# Patient Record
Sex: Male | Born: 1980 | Race: White | Hispanic: Yes | Marital: Single | Smoking: Current some day smoker
Health system: Southern US, Community
[De-identification: ages and names within clinical notes are randomized; demographics above are authoritative.]

## PROBLEM LIST (undated history)

## (undated) ENCOUNTER — Ambulatory Visit: Payer: BC Managed Care – PPO | Source: Home / Self Care

## (undated) DIAGNOSIS — Z21 Asymptomatic human immunodeficiency virus [HIV] infection status: Secondary | ICD-10-CM

## (undated) DIAGNOSIS — B009 Herpesviral infection, unspecified: Secondary | ICD-10-CM

## (undated) DIAGNOSIS — B20 Human immunodeficiency virus [HIV] disease: Secondary | ICD-10-CM

## (undated) DIAGNOSIS — K219 Gastro-esophageal reflux disease without esophagitis: Secondary | ICD-10-CM

## (undated) DIAGNOSIS — A549 Gonococcal infection, unspecified: Secondary | ICD-10-CM

## (undated) DIAGNOSIS — A539 Syphilis, unspecified: Secondary | ICD-10-CM

## (undated) HISTORY — DX: Gonococcal infection, unspecified: A54.9

## (undated) HISTORY — DX: Syphilis, unspecified: A53.9

## (undated) HISTORY — DX: Herpesviral infection, unspecified: B00.9

---

## 2004-08-01 ENCOUNTER — Emergency Department (HOSPITAL_COMMUNITY): Admission: EM | Admit: 2004-08-01 | Discharge: 2004-08-01 | Payer: Self-pay | Admitting: Emergency Medicine

## 2005-08-27 ENCOUNTER — Emergency Department (HOSPITAL_COMMUNITY): Admission: EM | Admit: 2005-08-27 | Discharge: 2005-08-27 | Payer: Self-pay | Admitting: Emergency Medicine

## 2006-09-22 ENCOUNTER — Emergency Department (HOSPITAL_COMMUNITY): Admission: EM | Admit: 2006-09-22 | Discharge: 2006-09-22 | Payer: Self-pay | Admitting: Family Medicine

## 2009-02-04 ENCOUNTER — Emergency Department (HOSPITAL_COMMUNITY): Admission: EM | Admit: 2009-02-04 | Discharge: 2009-02-04 | Payer: Self-pay | Admitting: Emergency Medicine

## 2009-03-16 ENCOUNTER — Encounter: Admission: RE | Admit: 2009-03-16 | Discharge: 2009-03-16 | Payer: Self-pay | Admitting: Family Medicine

## 2009-10-06 ENCOUNTER — Emergency Department (HOSPITAL_COMMUNITY): Admission: EM | Admit: 2009-10-06 | Discharge: 2009-10-06 | Payer: Self-pay | Admitting: Emergency Medicine

## 2018-11-14 ENCOUNTER — Other Ambulatory Visit: Payer: Self-pay

## 2018-11-14 DIAGNOSIS — Z20822 Contact with and (suspected) exposure to covid-19: Secondary | ICD-10-CM

## 2018-11-15 LAB — NOVEL CORONAVIRUS, NAA: SARS-CoV-2, NAA: NOT DETECTED

## 2019-02-02 ENCOUNTER — Other Ambulatory Visit: Payer: Self-pay

## 2019-02-02 DIAGNOSIS — Z20822 Contact with and (suspected) exposure to covid-19: Secondary | ICD-10-CM

## 2019-02-03 LAB — NOVEL CORONAVIRUS, NAA: SARS-CoV-2, NAA: NOT DETECTED

## 2019-06-18 ENCOUNTER — Other Ambulatory Visit: Payer: Self-pay

## 2019-06-18 ENCOUNTER — Encounter: Payer: Self-pay | Admitting: Podiatry

## 2019-06-18 ENCOUNTER — Ambulatory Visit: Payer: BC Managed Care – PPO | Admitting: Podiatry

## 2019-06-18 DIAGNOSIS — B351 Tinea unguium: Secondary | ICD-10-CM | POA: Diagnosis not present

## 2019-06-18 NOTE — Progress Notes (Signed)
  Subjective:  Patient ID: Collin Thomas, male    DOB: March 13, 1981,  MRN: [id number]  Chief Complaint  Patient presents with  . Nail Problem    Pt wants to discuss laser treatment for fungal toenails.    39 y.o. male presents with the above complaint.  Patient presents with left hallux onychomycosis that has been going on for a long period of time for years.  Patient would like to know if there is anything that can be done for this.  He has tried Lamisil but he has had sensitivity with it.  He had a culture done of the toenail by his primary care doctor that showed that patient has Trichophyton species.  Patient states he would like to consider doing laser therapy has less appears to be noninvasive.  He denies any other acute complaints.   Review of Systems: Negative except as noted in the HPI. Denies N/V/F/Ch.  No past medical history on file.  Current Outpatient Medications:  .  Cephalexin 500 MG tablet, TAKE 1 TABLET BY MOUTH THREE TIMES A DAY TAKE 1 TABLET 3 X A DAY WITH FOOD FOR 10 DAYS, Disp: , Rfl:  .  penicillin v potassium (VEETID) 500 MG tablet, Take 500 mg by mouth 2 (two) times daily., Disp: , Rfl:  .  sulfamethoxazole-trimethoprim (BACTRIM DS) 800-160 MG tablet, Take 1 tablet by mouth 2 (two) times daily., Disp: , Rfl:  .  valACYclovir (VALTREX) 500 MG tablet, Take 500 mg by mouth 2 (two) times daily., Disp: , Rfl:   Social History   Tobacco Use  Smoking Status Not on file    Not on File Objective:  There were no vitals filed for this visit. There is no height or weight on file to calculate BMI. Constitutional Well developed. Well nourished.  Vascular Dorsalis pedis pulses palpable bilaterally. Posterior tibial pulses palpable bilaterally. Capillary refill normal to all digits.  No cyanosis or clubbing noted. Pedal hair growth normal.  Neurologic Normal speech. Oriented to person, place, and time. Epicritic sensation to light touch grossly present bilaterally.    Dermatologic  left hallux onychomycosis with dystrophic discolored, darkened toenail.  Orthopedic: Normal joint ROM without pain or crepitus bilaterally. No visible deformities. No bony tenderness.   Radiographs: None Assessment:   1. Nail fungus   2. Onychomycosis due to dermatophyte    Plan:  Patient was evaluated and treated and all questions answered.  Left hallux onychomycosis -Educated the patient on the etiology of onychomycosis and various treatment options associated with improving the fungal load.  I explained to the patient that there is 3 treatment options available to treat the onychomycosis including topical, p.o., laser treatment.  He does not want to do oral therapy and therefore we will do a laser treatment.  I explained to the patient may take 6-7 sessions to help clear the nail.  Patient will be scheduled to see Collin Thomas for left hallux onychomycosis.     Return for See Collin Thomas for laser.

## 2019-07-01 DIAGNOSIS — S29012A Strain of muscle and tendon of back wall of thorax, initial encounter: Secondary | ICD-10-CM | POA: Diagnosis not present

## 2019-07-01 DIAGNOSIS — M9901 Segmental and somatic dysfunction of cervical region: Secondary | ICD-10-CM | POA: Diagnosis not present

## 2019-07-01 DIAGNOSIS — M5382 Other specified dorsopathies, cervical region: Secondary | ICD-10-CM | POA: Diagnosis not present

## 2019-07-15 ENCOUNTER — Other Ambulatory Visit: Payer: BC Managed Care – PPO

## 2019-07-20 DIAGNOSIS — Z7251 High risk heterosexual behavior: Secondary | ICD-10-CM | POA: Diagnosis not present

## 2019-07-20 DIAGNOSIS — N390 Urinary tract infection, site not specified: Secondary | ICD-10-CM | POA: Diagnosis not present

## 2019-07-31 ENCOUNTER — Ambulatory Visit: Payer: Self-pay | Attending: Internal Medicine

## 2019-07-31 DIAGNOSIS — Z23 Encounter for immunization: Secondary | ICD-10-CM

## 2019-07-31 NOTE — Progress Notes (Signed)
   Covid-19 Vaccination Clinic  Name:  GEAN LAROSE    MRN: [id number] DOB: Oct 26, 1980  07/31/2019  Mr. Henricksen was observed post Covid-19 immunization for 15 minutes without incident. He was provided with Vaccine Information Sheet and instruction to access the V-Safe system.   Mr. Nijjar was instructed to call 911 with any severe reactions post vaccine: Marland Kitchen Difficulty breathing  . Swelling of face and throat  . A fast heartbeat  . A bad rash all over body  . Dizziness and weakness   Immunizations Administered    Name Date Dose VIS Date Route   Pfizer COVID-19 Vaccine 07/31/2019  9:17 AM 0.3 mL 03/26/2019 Intramuscular   Manufacturer: [employer], [employer]   Lot: [id number]   NDC: [address]-7

## 2019-08-13 ENCOUNTER — Ambulatory Visit (INDEPENDENT_AMBULATORY_CARE_PROVIDER_SITE_OTHER): Payer: BC Managed Care – PPO | Admitting: *Deleted

## 2019-08-13 ENCOUNTER — Other Ambulatory Visit: Payer: Self-pay

## 2019-08-13 DIAGNOSIS — B351 Tinea unguium: Secondary | ICD-10-CM

## 2019-08-13 NOTE — Progress Notes (Signed)
Patient presents today for the 1st laser treatment. Diagnosed with mycotic nail infection by Dr. Allena Katz.   Toenail most affected is the hallux left.  All other systems are negative.  Nails were filed thin. Laser therapy was administered to 1-5 toenails bilateral and patient tolerated the treatment well. All safety precautions were in place.    Follow up in 4 weeks for laser # 2.  Explained that laser protocol is typically 6 treatments, once per month, however, patient states he would like to come indefinitely.

## 2019-08-13 NOTE — Patient Instructions (Signed)

## 2019-08-23 ENCOUNTER — Ambulatory Visit: Payer: Self-pay | Attending: Internal Medicine

## 2019-08-23 DIAGNOSIS — Z23 Encounter for immunization: Secondary | ICD-10-CM

## 2019-08-23 NOTE — Progress Notes (Signed)
   Covid-19 Vaccination Clinic  Name:  Collin Thomas    MRN: [id number] DOB: Sep 05, 1980  08/23/2019  Mr. [name] was observed post Covid-19 immunization for 15 minutes without incident. He was provided with Vaccine Information Sheet and instruction to access the V-Safe system.   Mr. [name] was instructed to call 911 with any severe reactions post vaccine: Marland Kitchen Difficulty breathing  . Swelling of face and throat  . A fast heartbeat  . A bad rash all over body  . Dizziness and weakness   Immunizations Administered    Name Date Dose VIS Date Route   Pfizer COVID-19 Vaccine 08/23/2019  9:47 AM 0.3 mL 06/09/2018 Intramuscular   Manufacturer: [employer], [employer]   Lot: [id number]   NDC: [address]-8

## 2019-09-10 ENCOUNTER — Other Ambulatory Visit: Payer: Self-pay

## 2019-09-10 ENCOUNTER — Ambulatory Visit (INDEPENDENT_AMBULATORY_CARE_PROVIDER_SITE_OTHER): Payer: BC Managed Care – PPO | Admitting: *Deleted

## 2019-09-10 DIAGNOSIS — B351 Tinea unguium: Secondary | ICD-10-CM

## 2019-09-10 NOTE — Progress Notes (Signed)
Patient presents today for the 2nd laser treatment. Diagnosed with mycotic nail infection by Dr. Allena Katz.   Toenail most affected is the hallux left. No change as of yet.  All other systems are negative.  Nails were filed thin. Laser therapy was administered to 1-5 toenails bilateral and patient tolerated the treatment well. All safety precautions were in place.    Follow up in 4 weeks for laser # 3  Explained that laser protocol is typically 6 treatments, once per month, however, patient states he would like to come indefinitely.

## 2019-10-13 ENCOUNTER — Other Ambulatory Visit: Payer: BC Managed Care – PPO

## 2019-10-15 ENCOUNTER — Other Ambulatory Visit: Payer: BC Managed Care – PPO

## 2019-10-27 ENCOUNTER — Telehealth: Payer: Self-pay | Admitting: Podiatry

## 2019-10-27 NOTE — Telephone Encounter (Signed)
I did receive a bill for a past due $100 bill for the laser treatment from the 09/10/19 appointment. I paid that and its reflecting on my billing statement as paid on 09/14/19. If you could please double check and make sure I get credit for that, I would greatly appreciate it. My number is [phone]. Thank you.

## 2019-11-22 ENCOUNTER — Other Ambulatory Visit: Payer: BC Managed Care – PPO

## 2019-12-02 ENCOUNTER — Other Ambulatory Visit: Payer: BC Managed Care – PPO

## 2019-12-06 ENCOUNTER — Other Ambulatory Visit: Payer: BC Managed Care – PPO

## 2019-12-27 DIAGNOSIS — L603 Nail dystrophy: Secondary | ICD-10-CM | POA: Diagnosis not present

## 2019-12-28 DIAGNOSIS — R0602 Shortness of breath: Secondary | ICD-10-CM | POA: Diagnosis not present

## 2019-12-28 DIAGNOSIS — Z131 Encounter for screening for diabetes mellitus: Secondary | ICD-10-CM | POA: Diagnosis not present

## 2019-12-28 DIAGNOSIS — Z1331 Encounter for screening for depression: Secondary | ICD-10-CM | POA: Diagnosis not present

## 2019-12-28 DIAGNOSIS — Z0000 Encounter for general adult medical examination without abnormal findings: Secondary | ICD-10-CM | POA: Diagnosis not present

## 2019-12-28 DIAGNOSIS — Z20822 Contact with and (suspected) exposure to covid-19: Secondary | ICD-10-CM | POA: Diagnosis not present

## 2019-12-28 DIAGNOSIS — D539 Nutritional anemia, unspecified: Secondary | ICD-10-CM | POA: Diagnosis not present

## 2019-12-28 DIAGNOSIS — R350 Frequency of micturition: Secondary | ICD-10-CM | POA: Diagnosis not present

## 2019-12-28 DIAGNOSIS — R5383 Other fatigue: Secondary | ICD-10-CM | POA: Diagnosis not present

## 2019-12-28 DIAGNOSIS — Z1159 Encounter for screening for other viral diseases: Secondary | ICD-10-CM | POA: Diagnosis not present

## 2019-12-28 DIAGNOSIS — A530 Latent syphilis, unspecified as early or late: Secondary | ICD-10-CM | POA: Diagnosis not present

## 2019-12-28 DIAGNOSIS — Z7251 High risk heterosexual behavior: Secondary | ICD-10-CM | POA: Diagnosis not present

## 2019-12-28 DIAGNOSIS — E559 Vitamin D deficiency, unspecified: Secondary | ICD-10-CM | POA: Diagnosis not present

## 2020-01-17 ENCOUNTER — Other Ambulatory Visit: Payer: BC Managed Care – PPO

## 2020-02-03 DIAGNOSIS — K120 Recurrent oral aphthae: Secondary | ICD-10-CM

## 2020-02-03 HISTORY — DX: Recurrent oral aphthae: K12.0

## 2020-02-18 DIAGNOSIS — M79645 Pain in left finger(s): Secondary | ICD-10-CM | POA: Diagnosis not present

## 2020-02-25 ENCOUNTER — Ambulatory Visit: Payer: BC Managed Care – PPO | Admitting: Podiatry

## 2020-02-28 ENCOUNTER — Other Ambulatory Visit: Payer: BC Managed Care – PPO

## 2020-03-17 ENCOUNTER — Ambulatory Visit: Payer: BC Managed Care – PPO | Admitting: Podiatry

## 2020-03-24 ENCOUNTER — Other Ambulatory Visit: Payer: BC Managed Care – PPO

## 2020-03-24 DIAGNOSIS — M79645 Pain in left finger(s): Secondary | ICD-10-CM | POA: Diagnosis not present

## 2020-04-21 DIAGNOSIS — L720 Epidermal cyst: Secondary | ICD-10-CM | POA: Diagnosis not present

## 2020-05-31 DIAGNOSIS — J069 Acute upper respiratory infection, unspecified: Secondary | ICD-10-CM | POA: Diagnosis not present

## 2020-05-31 DIAGNOSIS — R0602 Shortness of breath: Secondary | ICD-10-CM | POA: Diagnosis not present

## 2020-05-31 DIAGNOSIS — R059 Cough, unspecified: Secondary | ICD-10-CM | POA: Diagnosis not present

## 2020-06-08 DIAGNOSIS — J358 Other chronic diseases of tonsils and adenoids: Secondary | ICD-10-CM | POA: Diagnosis not present

## 2020-07-12 DIAGNOSIS — J309 Allergic rhinitis, unspecified: Secondary | ICD-10-CM | POA: Diagnosis not present

## 2020-07-12 DIAGNOSIS — J342 Deviated nasal septum: Secondary | ICD-10-CM | POA: Diagnosis not present

## 2020-07-12 DIAGNOSIS — R198 Other specified symptoms and signs involving the digestive system and abdomen: Secondary | ICD-10-CM | POA: Diagnosis not present

## 2020-07-12 DIAGNOSIS — J358 Other chronic diseases of tonsils and adenoids: Secondary | ICD-10-CM | POA: Diagnosis not present

## 2020-07-19 DIAGNOSIS — R509 Fever, unspecified: Secondary | ICD-10-CM | POA: Diagnosis not present

## 2020-07-19 DIAGNOSIS — J069 Acute upper respiratory infection, unspecified: Secondary | ICD-10-CM | POA: Diagnosis not present

## 2020-07-19 DIAGNOSIS — J302 Other seasonal allergic rhinitis: Secondary | ICD-10-CM | POA: Diagnosis not present

## 2020-07-23 ENCOUNTER — Emergency Department (HOSPITAL_COMMUNITY): Payer: BC Managed Care – PPO

## 2020-07-23 ENCOUNTER — Other Ambulatory Visit: Payer: Self-pay

## 2020-07-23 ENCOUNTER — Encounter (HOSPITAL_COMMUNITY): Payer: Self-pay | Admitting: Emergency Medicine

## 2020-07-23 ENCOUNTER — Emergency Department (HOSPITAL_COMMUNITY)
Admission: EM | Admit: 2020-07-23 | Discharge: 2020-07-23 | Disposition: A | Discharge status: Home / Self Care | Payer: BC Managed Care – PPO | Attending: Emergency Medicine | Admitting: Emergency Medicine

## 2020-07-23 DIAGNOSIS — R300 Dysuria: Secondary | ICD-10-CM | POA: Diagnosis not present

## 2020-07-23 DIAGNOSIS — R002 Palpitations: Secondary | ICD-10-CM | POA: Insufficient documentation

## 2020-07-23 DIAGNOSIS — F1510 Other stimulant abuse, uncomplicated: Secondary | ICD-10-CM | POA: Diagnosis not present

## 2020-07-23 DIAGNOSIS — R0789 Other chest pain: Secondary | ICD-10-CM | POA: Diagnosis not present

## 2020-07-23 DIAGNOSIS — F17200 Nicotine dependence, unspecified, uncomplicated: Secondary | ICD-10-CM | POA: Insufficient documentation

## 2020-07-23 DIAGNOSIS — R531 Weakness: Secondary | ICD-10-CM | POA: Diagnosis not present

## 2020-07-23 DIAGNOSIS — R030 Elevated blood-pressure reading, without diagnosis of hypertension: Secondary | ICD-10-CM | POA: Diagnosis not present

## 2020-07-23 DIAGNOSIS — R202 Paresthesia of skin: Secondary | ICD-10-CM | POA: Insufficient documentation

## 2020-07-23 DIAGNOSIS — R200 Anesthesia of skin: Secondary | ICD-10-CM | POA: Diagnosis not present

## 2020-07-23 DIAGNOSIS — R7989 Other specified abnormal findings of blood chemistry: Secondary | ICD-10-CM | POA: Diagnosis not present

## 2020-07-23 DIAGNOSIS — R000 Tachycardia, unspecified: Secondary | ICD-10-CM | POA: Diagnosis not present

## 2020-07-23 LAB — BASIC METABOLIC PANEL
Anion gap: 10 (ref 5–15)
BUN: 10 mg/dL (ref 6–20)
CO2: 22 mmol/L (ref 22–32)
Calcium: 9.4 mg/dL (ref 8.9–10.3)
Chloride: 103 mmol/L (ref 98–111)
Creatinine, Ser: 1.2 mg/dL (ref 0.61–1.24)
GFR, Estimated: >60 mL/min (ref >60)
Glucose, Bld: 111 mg/dL — ABNORMAL HIGH (ref 70–99)
Potassium: 3.6 mmol/L (ref 3.5–5.1)
Sodium: 135 mmol/L (ref 135–145)

## 2020-07-23 LAB — CBC
HCT: 41.8 % (ref 39.0–52.0)
Hemoglobin: 14.6 g/dL (ref 13.0–17.0)
MCH: 31.9 pg (ref 26.0–34.0)
MCHC: 34.9 g/dL (ref 30.0–36.0)
MCV: 91.3 fL (ref 80.0–100.0)
Platelets: 276 10*3/uL (ref 150–400)
RBC: 4.58 MIL/uL (ref 4.22–5.81)
RDW: 11.8 % (ref 11.5–15.5)
WBC: 10.3 10*3/uL (ref 4.0–10.5)
nRBC: 0 % (ref 0.0–0.2)

## 2020-07-23 LAB — TROPONIN I (HIGH SENSITIVITY)
Troponin I (High Sensitivity): 53 ng/L — ABNORMAL HIGH (ref <18)
Troponin I (High Sensitivity): 67 ng/L — ABNORMAL HIGH (ref <18)
Troponin I (High Sensitivity): 55 ng/L — ABNORMAL HIGH (ref <18)
Troponin I (High Sensitivity): 46 ng/L — ABNORMAL HIGH (ref <18)

## 2020-07-23 LAB — RAPID URINE DRUG SCREEN, HOSP PERFORMED
Amphetamines: POSITIVE — AB
Barbiturates: NOT DETECTED
Benzodiazepines: NOT DETECTED
Cocaine: NOT DETECTED
Opiates: NOT DETECTED
Tetrahydrocannabinol: NOT DETECTED

## 2020-07-23 LAB — D-DIMER, QUANTITATIVE: D-Dimer, Quant: 0.68 ug/mL-FEU — ABNORMAL HIGH (ref 0.00–0.50)

## 2020-07-23 MED ORDER — IOHEXOL 350 MG/ML SOLN
75.0000 mL | Freq: Once | INTRAVENOUS | Status: AC | PRN
  Administered 2020-07-23: 75 mL via INTRAVENOUS

## 2020-07-23 MED ORDER — DIAZEPAM 5 MG PO TABS
5.0000 mg | ORAL_TABLET | Freq: Once | ORAL | Status: AC
  Administered 2020-07-23: 5 mg via ORAL
  Filled 2020-07-23: qty 1

## 2020-07-23 NOTE — ED Notes (Signed)
Sitting up eating, no complaints, tolerating food and fluid well

## 2020-07-23 NOTE — ED Notes (Signed)
Patient gives permission to discuss his care with his aunt

## 2020-07-23 NOTE — ED Notes (Signed)
Pt in MRI, will draw troponin upon return

## 2020-07-23 NOTE — ED Notes (Signed)
ED Provider at bedside, dr floyd 

## 2020-07-23 NOTE — ED Provider Notes (Signed)
[facility] EM[facility]ENC[facility] Provider Note   CSN: [id number] Arrival date & time: 07/23/20  1159     History Chief Complaint  Patient presents with  . Tachycardia  . Numbness    Collin KiltsMatthew E Thomas is a 40 y.o. male.  40 yo M with a chief complaints of left arm numbness and hand weakness.  This occurred while the patient was driving his car.  Happened about 830 or 9:00 this morning.  He did go to a party last night denies any illegal drug use.  Denies any new stimulants.  The like his heart started racing when this occurred.  Realize that he was having trouble gripping the steering wheel and felt like his ability to clench his fist had diminished.  He thinks this lasted for about 30 minutes and then has improved but not resolved.  Still feels like his arm is numb especially on the lateral aspect of the shoulder.  He feels like a burning sensation.  Like maybe it is hot.  He denies any headache or neck pain.  Denies head injury denies cough congestion or fever.  Denies any prior medical history.  Denies supplement use.  The history is provided by the patient.  Illness Severity:  Moderate Onset quality:  Gradual Duration:  2 days Timing:  Constant Progression:  Worsening Chronicity:  New Associated symptoms: no abdominal pain, no chest pain, no congestion, no diarrhea, no fever, no headaches, no myalgias, no rash, no shortness of breath and no vomiting        History reviewed. No pertinent past medical history.  There are no problems to display for this patient.   History reviewed. No pertinent surgical history.     No family history on file.  Social History   Tobacco Use  . Smoking status: Current Every Day Smoker  . Smokeless tobacco: Never Used  Substance Use Topics  . Alcohol use: Yes  . Drug use: Yes    Types: Marijuana    Home Medications Prior to Admission medications   Medication Sig Start Date End Date Taking? Authorizing Provider   albuterol (VENTOLIN HFA) 108 (90 Base) MCG/ACT inhaler Inhale 2 puffs into the lungs every 6 (six) hours as needed for wheezing or shortness of breath.   Yes [provider]  Dextromethorphan-guaiFENesin (MUCINEX DM) 30-600 MG TB12 Take 1 tablet by mouth 2 (two) times daily as needed (for congestion/cold-like symptoms).   Yes [provider]  montelukast (SINGULAIR) 10 MG tablet Take 10 mg by mouth every evening.   Yes [provider]  valACYclovir (VALTREX) 500 MG tablet Take 500 mg by mouth 2 (two) times daily. 06/09/19  Yes [provider]    Allergies    Patient has no known allergies.  Review of Systems   Review of Systems  Constitutional: Negative for chills and fever.  HENT: Negative for congestion and facial swelling.   Eyes: Negative for discharge and visual disturbance.  Respiratory: Negative for shortness of breath.   Cardiovascular: Positive for palpitations. Negative for chest pain.  Gastrointestinal: Negative for abdominal pain, diarrhea and vomiting.  Musculoskeletal: Negative for arthralgias and myalgias.  Skin: Negative for color change and rash.  Neurological: Positive for weakness and numbness. Negative for tremors, syncope and headaches.  Psychiatric/Behavioral: Negative for confusion and dysphoric mood.    Physical Exam Updated Vital Signs BP (!) 134/98   Pulse (!) 106   Temp 98.5 F (36.9 C) (Oral)   Resp 18   SpO2  98%   Physical Exam Vitals and nursing note reviewed.  Constitutional:      Appearance: He is well-developed.  HENT:     Head: Normocephalic and atraumatic.  Eyes:     Pupils: Pupils are equal, round, and reactive to light.  Neck:     Vascular: No JVD.  Cardiovascular:     Rate and Rhythm: Normal rate and regular rhythm.     Heart sounds: No murmur heard. No friction rub. No gallop.   Pulmonary:     Effort: No respiratory distress.     Breath sounds: No wheezing.  Abdominal:     General: There is  no distension.     Tenderness: There is no guarding or rebound.  Musculoskeletal:        General: Normal range of motion.     Cervical back: Normal range of motion and neck supple.     Comments: Pulse motor and sensation intact the left upper extremity.  No appreciable weakness.  Full range of motion of the left shoulder without pain.  No pain along the trapezius.  Negative Spurling's test bilaterally.  Skin:    Coloration: Skin is not pale.     Findings: No rash.  Neurological:     Mental Status: He is alert and oriented to person, place, and time.  Psychiatric:        Behavior: Behavior normal.     ED Results / Procedures / Treatments   Labs (all labs ordered are listed, but only abnormal results are displayed) Labs Reviewed  BASIC METABOLIC PANEL - Abnormal; Notable for the following components:      Result Value   Glucose, Bld 111 (*)    All other components within normal limits  RAPID URINE DRUG SCREEN, HOSP PERFORMED - Abnormal; Notable for the following components:   Amphetamines POSITIVE (*)    All other components within normal limits  D-DIMER, QUANTITATIVE - Abnormal; Notable for the following components:   D-Dimer, Quant 0.68 (*)    All other components within normal limits  TROPONIN I (HIGH SENSITIVITY) - Abnormal; Notable for the following components:   Troponin I (High Sensitivity) 53 (*)    All other components within normal limits  TROPONIN I (HIGH SENSITIVITY) - Abnormal; Notable for the following components:   Troponin I (High Sensitivity) 67 (*)    All other components within normal limits  TROPONIN I (HIGH SENSITIVITY) - Abnormal; Notable for the following components:   Troponin I (High Sensitivity) 55 (*)    All other components within normal limits  TROPONIN I (HIGH SENSITIVITY) - Abnormal; Notable for the following components:   Troponin I (High Sensitivity) 46 (*)    All other components within normal limits  CBC    EKG EKG  Interpretation  Date/Time:  Sunday July 23 2020 12:17:22 EDT Ventricular Rate:  123 PR Interval:  138 QRS Duration: 82 QT Interval:  324 QTC Calculation: 463 R Axis:   84 Text Interpretation: Sinus tachycardia Minimal voltage criteria for LVH, may be normal variant ( Sokolow-Lyon ) Borderline ECG No old tracing to compare Confirmed by Melene Plan [phone]) on 07/23/2020 12:43:36 PM   Radiology CT Angio Chest PE W/Cm &/Or Wo Cm  Result Date: 07/23/2020 CLINICAL DATA:  Positive D-dimer. Tachycardia. PE suspected, low/intermediate prob, positive D-dimer EXAM: CT ANGIOGRAPHY CHEST WITH CONTRAST TECHNIQUE: Multidetector CT imaging of the chest was performed using the standard protocol during bolus administration of intravenous contrast. Multiplanar CT image reconstructions and MIPs were obtained  to evaluate the vascular anatomy. CONTRAST:  44mL OMNIPAQUE IOHEXOL 350 MG/ML SOLN COMPARISON:  None. FINDINGS: Cardiovascular: Contrast injection is sufficient to demonstrate satisfactory opacification of the pulmonary arteries to the segmental level. There is no pulmonary embolus or evidence of right heart strain. The size of the main pulmonary artery is normal. Heart size is normal, with no pericardial effusion. The course and caliber of the aorta are normal. There is no atherosclerotic calcification. Opacification decreased due to pulmonary arterial phase contrast bolus timing. Mediastinum/Nodes: No mediastinal, hilar or axillary lymphadenopathy. Normal visualized thyroid. Thoracic esophageal course is normal. Lungs/Pleura: Airways are patent. No pleural effusion, lobar consolidation, pneumothorax or pulmonary infarction. Upper Abdomen: Contrast bolus timing is not optimized for evaluation of the abdominal organs. The visualized portions of the organs of the upper abdomen are normal. Musculoskeletal: No chest wall abnormality. No bony spinal canal stenosis. Review of the MIP images confirms the above findings.  IMPRESSION: No pulmonary embolus or other acute thoracic abnormality. Electronically Signed   By: Deatra Robinson M.D.   On: 07/23/2020 19:57   MR BRAIN WO CONTRAST  Result Date: 07/23/2020 CLINICAL DATA:  Acute neuro deficit.  Left arm numbness. EXAM: MRI HEAD WITHOUT CONTRAST TECHNIQUE: Multiplanar, multiecho pulse sequences of the brain and surrounding structures were obtained without intravenous contrast. COMPARISON:  None. FINDINGS: Brain: No acute infarction, hemorrhage, hydrocephalus, extra-axial collection or mass lesion. Normal white matter. Vascular: Normal arterial flow voids Skull and upper cervical spine: Negative Sinuses/Orbits: Mild mucosal edema paranasal sinuses. Negative orbit Other: None IMPRESSION: Normal MRI of the brain Paranasal sinus mucosal edema. Electronically Signed   By: Marlan Palau M.D.   On: 07/23/2020 15:06    Procedures Procedures   Medications Ordered in ED Medications  diazepam (VALIUM) tablet 5 mg (5 mg Oral Given 07/23/20 1349)  iohexol (OMNIPAQUE) 350 MG/ML injection 75 mL (75 mLs Intravenous Contrast Given 07/23/20 1942)    ED Course  I have reviewed the triage vital signs and the nursing notes.  Pertinent labs & imaging results that were available during my care of the patient were reviewed by me and considered in my medical decision making (see chart for details).    MDM Rules/Calculators/A&P                          40 yo M with a chief complaints of left arm numbness and decreased grip strength.  This occurred suddenly while he was driving his car this morning.  Happened about 4 hours ago.  Symptoms have improved but now resolved.  No obvious finding on physical exam.  Discussed with Dr. Wilford Corner, neurology, recommends MRI brain.  MRI negative.   Trop elevated at 50.  Discussed with Dr. Johney Frame, cardiology.  Recommends delta if no change or improvement would have follow up as outpatient, if increasing reasonable to have hospitalist admit for serial  trop.   Signed out to Dr. Denton Lank, please see their note for further details of care in the ED.  The patients results and plan were reviewed and discussed.   Any x-rays performed were independently reviewed by myself.   Differential diagnosis were considered with the presenting HPI.  Medications  diazepam (VALIUM) tablet 5 mg (5 mg Oral Given 07/23/20 1349)  iohexol (OMNIPAQUE) 350 MG/ML injection 75 mL (75 mLs Intravenous Contrast Given 07/23/20 1942)    Vitals:   07/23/20 1830 07/23/20 1845 07/23/20 1915 07/23/20 2031  BP: (!) 142/106 (!) 159/107 (!) 145/99 Marland Kitchen)  134/98  Pulse: (!) 101 100 61 (!) 106  Resp: 17 14 13 18   Temp:    98.5 F (36.9 C)  TempSrc:    Oral  SpO2: 97% 98% 100% 98%    Final diagnoses:  Paresthesia  Atypical chest pain  Methamphetamine abuse (HCC)  Elevated blood pressure reading    Admission/ observation were discussed with the admitting physician, patient and/or family and they are comfortable with the plan.    Final Clinical Impression(s) / ED Diagnoses Final diagnoses:  Paresthesia  Atypical chest pain  Methamphetamine abuse (HCC)  Elevated blood pressure reading    Rx / DC Orders ED Discharge Orders    None       , DO 07/24/20 [phone]

## 2020-07-23 NOTE — ED Provider Notes (Signed)
Signed out by Dr Adela Lank to d/c to home when repeat trop trending down. States recent methamphetamine use.   Additional delta trops trending down. Patient did have ddimer that was mildly elevated - cta neg for PE.    Pt does note that earlier heart rate was 120-140 - ?tachycardia/demand related mild /transient bump in trop.   Currently hr is 94, sinus. Pt is breathing comfortably and room air sats are 98-99%.   Pt is eating/drinking, no distress, and currently appears stable for d/c.   Rec pcp f/u, including f/u for elevated bp.   Return precautions provided.      Cathren Laine, MD 07/23/20 2004

## 2020-07-23 NOTE — ED Triage Notes (Signed)
Reports elevated HR and L arm numbness that started around 9am while driving.  No numbness at present.  No arm drift.

## 2020-07-23 NOTE — Discharge Instructions (Addendum)
It was our pleasure to provide your ER care today - we hope that you feel better.  For your health/safety, make sure to avoid meth and/or other drug use - see resource guide provide rehab/treatment facility programs.   Your blood pressure is high today - limit salt intake, follow heart healthy eating plan, and follow up with primary care doctor in the coming week for recheck.  Also follow up with cardiologist in the next 1-2 weeks - call office to arrange appointment.   Return to ER if worse, new symptoms, fevers, new or severe pain, chest pain, increased trouble breathing, fainting, or other concern.

## 2020-08-28 DIAGNOSIS — Z7252 High risk homosexual behavior: Secondary | ICD-10-CM | POA: Diagnosis not present

## 2020-08-28 DIAGNOSIS — Z717 Human immunodeficiency virus [HIV] counseling: Secondary | ICD-10-CM | POA: Diagnosis not present

## 2020-11-01 ENCOUNTER — Other Ambulatory Visit: Payer: Self-pay

## 2020-11-01 ENCOUNTER — Ambulatory Visit: Payer: BC Managed Care – PPO | Admitting: Podiatry

## 2020-11-01 DIAGNOSIS — L603 Nail dystrophy: Secondary | ICD-10-CM | POA: Diagnosis not present

## 2020-11-07 ENCOUNTER — Encounter: Payer: Self-pay | Admitting: Podiatry

## 2020-11-07 NOTE — Progress Notes (Signed)
  Subjective:  Patient ID: Collin Thomas, male    DOB: March 24, 1981,  MRN: [id number]  Chief Complaint  Patient presents with   Nail Problem    PT would like his left nail removed    40 y.o. male presents with the above complaint.  Patient presents with follow-up to left hallux onychomycosis well aligned no dystrophy.  Patient would like to think about having the nail removed.  He is not sure if he can have it done today.  Laser did not help and has made it just to stick.  He denies any other acute complaints.   Review of Systems: Negative except as noted in the HPI. Denies N/V/F/Ch.  No past medical history on file.  Current Outpatient Medications:    albuterol (VENTOLIN HFA) 108 (90 Base) MCG/ACT inhaler, Inhale 2 puffs into the lungs every 6 (six) hours as needed for wheezing or shortness of breath., Disp: , Rfl:    Dextromethorphan-guaiFENesin (MUCINEX DM) 30-600 MG TB12, Take 1 tablet by mouth 2 (two) times daily as needed (for congestion/cold-like symptoms)., Disp: , Rfl:    montelukast (SINGULAIR) 10 MG tablet, Take 10 mg by mouth every evening., Disp: , Rfl:    valACYclovir (VALTREX) 500 MG tablet, Take 500 mg by mouth 2 (two) times daily., Disp: , Rfl:   Social History   Tobacco Use  Smoking Status Every Day  Smokeless Tobacco Never    No Known Allergies Objective:  There were no vitals filed for this visit. There is no height or weight on file to calculate BMI. Constitutional Well developed. Well nourished.  Vascular Dorsalis pedis pulses palpable bilaterally. Posterior tibial pulses palpable bilaterally. Capillary refill normal to all digits.  No cyanosis or clubbing noted. Pedal hair growth normal.  Neurologic Normal speech. Oriented to person, place, and time. Epicritic sensation to light touch grossly present bilaterally.  Dermatologic  left hallux onychomycosis with dystrophic discolored, darkened toenail.  With underlying nail dystrophy.  Pain on palpation  to the nail  Orthopedic: Normal joint ROM without pain or crepitus bilaterally. No visible deformities. No bony tenderness.   Radiographs: None Assessment:   1. Nail dystrophy     Plan:  Patient was evaluated and treated and all questions answered.  Left hallux onychomycosis with underlying nail dystrophy -I explained to patient the etiology of onychomycosis nail dystrophy and various treatment options were extensively discussed.  Given that patient has failed laser treatment he would like to have it removed however he has plans coming up over the next week or 2 have asked him to come see me again in 4 weeks and we can remove the nail.  Patient states understanding and would like to proceed with a nail avulsion 1 I see him back again in 4 weeks.     No follow-ups on file.

## 2020-11-08 DIAGNOSIS — M7661 Achilles tendinitis, right leg: Secondary | ICD-10-CM | POA: Diagnosis not present

## 2020-11-16 DIAGNOSIS — L720 Epidermal cyst: Secondary | ICD-10-CM | POA: Diagnosis not present

## 2020-11-16 DIAGNOSIS — L700 Acne vulgaris: Secondary | ICD-10-CM | POA: Diagnosis not present

## 2020-11-28 DIAGNOSIS — E7800 Pure hypercholesterolemia, unspecified: Secondary | ICD-10-CM | POA: Diagnosis not present

## 2020-11-28 DIAGNOSIS — Z1159 Encounter for screening for other viral diseases: Secondary | ICD-10-CM | POA: Diagnosis not present

## 2020-11-28 DIAGNOSIS — R350 Frequency of micturition: Secondary | ICD-10-CM | POA: Diagnosis not present

## 2020-11-28 DIAGNOSIS — E559 Vitamin D deficiency, unspecified: Secondary | ICD-10-CM | POA: Diagnosis not present

## 2020-11-28 DIAGNOSIS — B009 Herpesviral infection, unspecified: Secondary | ICD-10-CM | POA: Diagnosis not present

## 2020-11-28 DIAGNOSIS — Z79899 Other long term (current) drug therapy: Secondary | ICD-10-CM | POA: Diagnosis not present

## 2020-11-28 DIAGNOSIS — Z23 Encounter for immunization: Secondary | ICD-10-CM | POA: Diagnosis not present

## 2020-11-28 DIAGNOSIS — Z131 Encounter for screening for diabetes mellitus: Secondary | ICD-10-CM | POA: Diagnosis not present

## 2020-11-28 DIAGNOSIS — R5383 Other fatigue: Secondary | ICD-10-CM | POA: Diagnosis not present

## 2020-11-28 DIAGNOSIS — D539 Nutritional anemia, unspecified: Secondary | ICD-10-CM | POA: Diagnosis not present

## 2020-11-28 DIAGNOSIS — Z7251 High risk heterosexual behavior: Secondary | ICD-10-CM | POA: Diagnosis not present

## 2020-12-01 ENCOUNTER — Ambulatory Visit: Payer: BC Managed Care – PPO | Admitting: Podiatry

## 2020-12-13 ENCOUNTER — Ambulatory Visit: Payer: BC Managed Care – PPO | Admitting: Podiatry

## 2021-01-16 DIAGNOSIS — R1013 Epigastric pain: Secondary | ICD-10-CM | POA: Diagnosis not present

## 2021-01-16 DIAGNOSIS — K219 Gastro-esophageal reflux disease without esophagitis: Secondary | ICD-10-CM | POA: Diagnosis not present

## 2021-01-16 DIAGNOSIS — R140 Abdominal distension (gaseous): Secondary | ICD-10-CM | POA: Diagnosis not present

## 2021-04-24 DIAGNOSIS — L700 Acne vulgaris: Secondary | ICD-10-CM | POA: Diagnosis not present

## 2021-05-04 ENCOUNTER — Encounter (HOSPITAL_COMMUNITY): Payer: Self-pay

## 2021-05-04 ENCOUNTER — Emergency Department (HOSPITAL_COMMUNITY)
Admission: EM | Admit: 2021-05-04 | Discharge: 2021-05-04 | Disposition: A | Discharge status: Home / Self Care | Payer: BC Managed Care – PPO | Attending: Emergency Medicine | Admitting: Emergency Medicine

## 2021-05-04 ENCOUNTER — Other Ambulatory Visit: Payer: Self-pay

## 2021-05-04 DIAGNOSIS — K1379 Other lesions of oral mucosa: Secondary | ICD-10-CM

## 2021-05-04 DIAGNOSIS — R55 Syncope and collapse: Secondary | ICD-10-CM | POA: Insufficient documentation

## 2021-05-04 DIAGNOSIS — R197 Diarrhea, unspecified: Secondary | ICD-10-CM | POA: Diagnosis not present

## 2021-05-04 DIAGNOSIS — K1370 Unspecified lesions of oral mucosa: Secondary | ICD-10-CM | POA: Insufficient documentation

## 2021-05-04 LAB — COMPREHENSIVE METABOLIC PANEL
ALT: 26 U/L (ref 0–44)
AST: 37 U/L (ref 15–41)
Albumin: 3.8 g/dL (ref 3.5–5.0)
Alkaline Phosphatase: 36 U/L — ABNORMAL LOW (ref 38–126)
Anion gap: 10 (ref 5–15)
BUN: 11 mg/dL (ref 6–20)
CO2: 21 mmol/L — ABNORMAL LOW (ref 22–32)
Calcium: 8.8 mg/dL — ABNORMAL LOW (ref 8.9–10.3)
Chloride: 99 mmol/L (ref 98–111)
Creatinine, Ser: 1.1 mg/dL (ref 0.61–1.24)
GFR, Estimated: >60 mL/min (ref >60)
Glucose, Bld: 93 mg/dL (ref 70–99)
Potassium: 4 mmol/L (ref 3.5–5.1)
Sodium: 130 mmol/L — ABNORMAL LOW (ref 135–145)
Total Bilirubin: 0.6 mg/dL (ref 0.3–1.2)
Total Protein: 6.9 g/dL (ref 6.5–8.1)

## 2021-05-04 LAB — CBC
HCT: 44.3 % (ref 39.0–52.0)
Hemoglobin: 15 g/dL (ref 13.0–17.0)
MCH: 31.2 pg (ref 26.0–34.0)
MCHC: 33.9 g/dL (ref 30.0–36.0)
MCV: 92.1 fL (ref 80.0–100.0)
Platelets: 135 10*3/uL — ABNORMAL LOW (ref 150–400)
RBC: 4.81 MIL/uL (ref 4.22–5.81)
RDW: 12.4 % (ref 11.5–15.5)
WBC: 3.9 10*3/uL — ABNORMAL LOW (ref 4.0–10.5)
nRBC: 0 % (ref 0.0–0.2)

## 2021-05-04 LAB — HIV ANTIBODY (ROUTINE TESTING W REFLEX): HIV Screen 4th Generation wRfx: REACTIVE — AB

## 2021-05-04 LAB — LIPASE, BLOOD: Lipase: 75 U/L — ABNORMAL HIGH (ref 11–51)

## 2021-05-04 MED ORDER — LIDOCAINE VISCOUS HCL 2 % MT SOLN: 5.0000 mL | Freq: Three times a day (TID) | OROMUCOSAL | 0 refills | Status: DC | PRN

## 2021-05-04 NOTE — ED Notes (Signed)
Pt reports he was working at his computer when everything started getting blurry and he started sweating. That is when he felt light headed and had to lay on the floor. Did not hit his head, no LOC, and reports this is the first time it's happened. He does explain that hes had diarrhea for 2 days now. Pt does explain that he has been having anxiety and stress lately

## 2021-05-04 NOTE — ED Provider Notes (Signed)
[facility] EMERGENCY DEPARTMENT Provider Note   CSN: [id number] Arrival date & time: 05/04/21  [id number]     History  Chief Complaint  Patient presents with   Near Syncope    Collin Thomas is a 41 y.o. male.  41 yo M with a chief complaints of feeling fatigued.  The patient had an episode at work where he almost passed out.  States that he started feeling unwell he felt his vision was going he got sweaty he stood up and felt worse and then laid down and called 911.  Was evaluated by the EMS providers who suggested that he was doing well at this time and did not need transport to the hospital.  Was told that he was dehydrated and since then he has not been eating but has been trying to push fluids.  He has been feeling more fatigued and is started to experience diarrhea.  He also tried to drink apple cider vinegar and after which has had some pain and sores to his mouth.  He thinks maybe he did not diluted enough.  Having some subjective fevers at home.  Denies cough or congestion.  The history is provided by the patient.  Near Syncope This is a new problem. The current episode started 2 days ago. The problem occurs rarely. The problem has been resolved. Pertinent negatives include no chest pain, no abdominal pain, no headaches and no shortness of breath. Nothing aggravates the symptoms. Nothing relieves the symptoms. He has tried nothing for the symptoms.      Home Medications Prior to Admission medications   Medication Sig Start Date End Date Taking? Authorizing Provider  magic mouthwash (lidocaine, diphenhydrAMINE, alum & mag hydroxide) suspension Swish and spit 5 mLs 3 (three) times daily as needed for mouth pain. 05/04/21  Yes Deno Etienne, DO  albuterol (VENTOLIN HFA) 108 (90 Base) MCG/ACT inhaler Inhale 2 puffs into the lungs every 6 (six) hours as needed for wheezing or shortness of breath.    [provider]  Dextromethorphan-guaiFENesin (MUCINEX DM)  30-600 MG TB12 Take 1 tablet by mouth 2 (two) times daily as needed (for congestion/cold-like symptoms).    [provider]  montelukast (SINGULAIR) 10 MG tablet Take 10 mg by mouth every evening.    [provider]  valACYclovir (VALTREX) 500 MG tablet Take 500 mg by mouth 2 (two) times daily. 06/09/19   [provider]      Allergies    Patient has no known allergies.    Review of Systems   Review of Systems  Constitutional:  Positive for fatigue. Negative for chills and fever.  HENT:  Negative for congestion and facial swelling.   Eyes:  Negative for discharge and visual disturbance.  Respiratory:  Negative for shortness of breath.   Cardiovascular:  Positive for near-syncope. Negative for chest pain and palpitations.  Gastrointestinal:  Positive for diarrhea. Negative for abdominal pain and vomiting.  Musculoskeletal:  Negative for arthralgias and myalgias.  Skin:  Negative for color change and rash.  Neurological:  Negative for tremors, syncope and headaches.  Psychiatric/Behavioral:  Negative for confusion and dysphoric mood.    Physical Exam Updated Vital Signs BP 111/75    Pulse 62    Temp 98.2 F (36.8 C) (Oral)    Resp 19    SpO2 100%  Physical Exam Vitals and nursing note reviewed.  Constitutional:      Appearance: He is well-developed.  HENT:     Head:  Normocephalic and atraumatic.     Mouth/Throat:     Comments: Erythematous areas to the sublingual region.  No ulceration. Eyes:     Pupils: Pupils are equal, round, and reactive to light.  Neck:     Vascular: No JVD.  Cardiovascular:     Rate and Rhythm: Normal rate and regular rhythm.     Heart sounds: No murmur heard.   No friction rub. No gallop.  Pulmonary:     Effort: No respiratory distress.     Breath sounds: No wheezing.  Abdominal:     General: There is no distension.     Tenderness: There is no abdominal tenderness. There is no guarding or rebound.  Musculoskeletal:         General: Normal range of motion.     Cervical back: Normal range of motion and neck supple.  Skin:    Coloration: Skin is not pale.     Findings: No rash.  Neurological:     Mental Status: He is alert and oriented to person, place, and time.  Psychiatric:        Behavior: Behavior normal.    ED Results / Procedures / Treatments   Labs (all labs ordered are listed, but only abnormal results are displayed) Labs Reviewed  LIPASE, BLOOD - Abnormal; Notable for the following components:      Result Value   Lipase 75 (*)    All other components within normal limits  COMPREHENSIVE METABOLIC PANEL - Abnormal; Notable for the following components:   Sodium 130 (*)    CO2 21 (*)    Calcium 8.8 (*)    Alkaline Phosphatase 36 (*)    All other components within normal limits  CBC - Abnormal; Notable for the following components:   WBC 3.9 (*)    Platelets 135 (*)    All other components within normal limits  URINALYSIS, ROUTINE W REFLEX MICROSCOPIC  HIV ANTIBODY (ROUTINE TESTING W REFLEX)    EKG EKG Interpretation  Date/Time:  Friday May 04 2021 09:22:29 EST Ventricular Rate:  68 PR Interval:  110 QRS Duration: 82 QT Interval:  376 QTC Calculation: 399 R Axis:   84 Text Interpretation: Sinus rhythm with sinus arrhythmia with short PR Otherwise normal ECG When compared with ECG of 23-Jul-2020 12:17, PREVIOUS ECG IS PRESENT short pr without delta wave, no brugada or prolonged qt Confirmed by Deno Etienne [phone]) on 05/04/2021 11:39:52 AM  Radiology No results found.  Procedures Procedures    Medications Ordered in ED Medications - No data to display  ED Course/ Medical Decision Making/ A&P                           Medical Decision Making  41 yo M with a chief complaints of fatigue.  The patient had a near syncopal event a couple days ago that sounds vasovagal by history.  Since then the patient has been pounding fluids and not having anything to eat.  Has become  increasingly fatigued.  His symptoms could be due to fasting for the past 48 hours.  The other consideration I had was that the patient was acute HIV infection.  Patient with recent visit to urgent care where he had had consensual sex with an HIV-positive person.  We will send off HIV testing.  Given ID follow-up.  The patient's sodium level is a bit low as well as his chloride level is borderline low.  I suspect  this may be due to increased free water intake.  Leukopenia.  2:39 PM:  I have discussed the diagnosis/risks/treatment options with the patient and believe the pt to be eligible for discharge home to follow-up with PCP. We also discussed returning to the ED immediately if new or worsening sx occur. We discussed the sx which are most concerning (e.g., sudden worsening pain, fever, inability to tolerate by mouth) that necessitate immediate return. Medications administered to the patient during their visit and any new prescriptions provided to the patient are listed below.   Medications given during this visit Medications - No data to display   The patient appears reasonably screen and/or stabilized for discharge and I doubt any other medical condition or other [facility] requiring further screening, evaluation, or treatment in the ED at this time prior to discharge.          Final Clinical Impression(s) / ED Diagnoses Final diagnoses:  Vasovagal syncope  Diarrhea, unspecified type  Mouth sores    Rx / DC Orders ED Discharge Orders          Ordered    magic mouthwash (lidocaine, diphenhydrAMINE, alum & mag hydroxide) suspension  3 times daily PRN        05/04/21 1231              Deno Etienne, DO 05/04/21 1439

## 2021-05-04 NOTE — ED Provider Triage Note (Signed)
Emergency Medicine Provider Triage Evaluation Note  Collin Thomas , a 41 y.o. male  was evaluated in triage.  Pt complains of dehydration. He states a few days ago while working he felt weak and dizzy with blurry vision. Patient called EMS at that time and they told him he was "severely dehydrated" and told him to drink fluids and rest. He reports he frequent diarrhea. He also believes he may have burned his throat with apple cide vinegar that he purchased from a grocery store. He reported a max temperature of 100.76F at home.   Review of Systems  Positive: Fever, chills, nausea, diarrhea, sore throat Negative: Abdominal pain, vomiting  Physical Exam  BP (!) 116/105 (BP Location: Right Arm)    Pulse 68    Temp 98.2 F (36.8 C) (Oral)    Resp 16    SpO2 99%  Gen:   Awake, no distress   Resp:  Normal effort  MSK:   Moves extremities without difficulty  Other:    Medical Decision Making  Medically screening exam initiated at 9:24 AM.  Appropriate orders placed.  KEITHAN GIOVANELLI was informed that the remainder of the evaluation will be completed by another provider, this initial triage assessment does not replace that evaluation, and the importance of remaining in the ED until their evaluation is complete.     Kateri Plummer, [place] 05/04/21 [id number]

## 2021-05-04 NOTE — ED Triage Notes (Addendum)
Patient here requesting evaluation of what he suspects is dehydration. Patient states a few days ago while working he suddenly felt weak, dizzy, and his vision blurred so he called 911. Patient states EMS told him he was severely dehydrated and he was told to drink fluids and rest. Patient also reports frequent diarrhea. Patient is alert, oriented, ambulatory, and in no apparent distress at this time.  Patient also states he may have burned his throat with apple cider vinegar he purchased from a grocery store.

## 2021-05-04 NOTE — Discharge Instructions (Signed)
Eat and drink as well as you can for the next 48 hours.  Please follow-up with infectious disease.  Please return to the Emergency Department for worsening symptoms abdominal pain inability eat or drink bloody diarrhea.

## 2021-05-08 LAB — HIV-1/2 AB - DIFFERENTIATION
HIV 1 Ab: NONREACTIVE
HIV 2 Ab: UNDETERMINED

## 2021-05-08 LAB — HIV-1/HIV-2 QUALITATIVE RNA
HIV-1 RNA, Qualitative: REACTIVE — AB
HIV-2 RNA, Qualitative: NONREACTIVE

## 2021-05-09 ENCOUNTER — Telehealth: Payer: Self-pay

## 2021-05-09 NOTE — Telephone Encounter (Signed)
Received call from [facility] with [facility]. He received referral and will contact patient.   Sandie Ano, RN

## 2021-05-09 NOTE — Telephone Encounter (Signed)
Patient with reactive HIV screening and reactive HIV-1 RNA. Referral faxed to [facility] to notify patient and link to care.   Beryle Flock, RN

## 2021-05-10 ENCOUNTER — Ambulatory Visit: Payer: BC Managed Care – PPO | Admitting: Internal Medicine

## 2021-05-10 ENCOUNTER — Other Ambulatory Visit (HOSPITAL_COMMUNITY): Payer: Self-pay

## 2021-05-10 ENCOUNTER — Other Ambulatory Visit (HOSPITAL_COMMUNITY)
Admission: RE | Admit: 2021-05-10 | Discharge: 2021-05-10 | Disposition: A | Discharge status: Home / Self Care | Payer: BC Managed Care – PPO | Source: Ambulatory Visit | Attending: Internal Medicine | Admitting: Internal Medicine

## 2021-05-10 ENCOUNTER — Encounter: Payer: Self-pay | Admitting: Internal Medicine

## 2021-05-10 ENCOUNTER — Ambulatory Visit: Payer: BC Managed Care – PPO | Admitting: Pharmacist

## 2021-05-10 ENCOUNTER — Other Ambulatory Visit: Payer: Self-pay

## 2021-05-10 ENCOUNTER — Other Ambulatory Visit: Payer: Self-pay | Admitting: Internal Medicine

## 2021-05-10 ENCOUNTER — Telehealth: Payer: Self-pay

## 2021-05-10 DIAGNOSIS — Z21 Asymptomatic human immunodeficiency virus [HIV] infection status: Secondary | ICD-10-CM

## 2021-05-10 DIAGNOSIS — Z7251 High risk heterosexual behavior: Secondary | ICD-10-CM

## 2021-05-10 DIAGNOSIS — B20 Human immunodeficiency virus [HIV] disease: Secondary | ICD-10-CM

## 2021-05-10 DIAGNOSIS — Z7252 High risk homosexual behavior: Secondary | ICD-10-CM

## 2021-05-10 HISTORY — DX: High risk heterosexual behavior: Z72.51

## 2021-05-10 HISTORY — DX: Human immunodeficiency virus (HIV) disease: B20

## 2021-05-10 MED ORDER — BICTEGRAVIR-EMTRICITAB-TENOFOV 50-200-25 MG PO TABS: 1.0000 | ORAL_TABLET | Freq: Every day | ORAL | 0 refills | Status: DC

## 2021-05-10 NOTE — Progress Notes (Signed)
HPI: Collin Thomas is a 41 y.o. male who presents to the [facility] clinic for rapid initiation of ART for newly diagnosed acute HIV infection.  Patient Active Problem List   Diagnosis Date Noted   HIV test positive (HCC) 05/10/2021   High risk sexual behavior 05/10/2021    Patient's Medications  New Prescriptions   No medications on file  Previous Medications   MAGIC MOUTHWASH (LIDOCAINE, DIPHENHYDRAMINE, ALUM & MAG HYDROXIDE) SUSPENSION    Swish and spit 5 mLs 3 (three) times daily as needed for mouth pain.  Modified Medications   No medications on file  Discontinued Medications   No medications on file    Allergies: No Known Allergies  Past Medical History: Past Medical History:  Diagnosis Date   Gonorrhea    Herpes    Syphilis     Social History: Social History   Socioeconomic History   Marital status: Single    Spouse name: Not on file   Number of children: Not on file   Years of education: Not on file   Highest education level: Not on file  Occupational History   Not on file  Tobacco Use   Smoking status: Every Day   Smokeless tobacco: Never  Substance and Sexual Activity   Alcohol use: Yes   Drug use: Yes    Types: Marijuana   Sexual activity: Not on file    Comment: declined condoms  Other Topics Concern   Not on file  Social History Narrative   Not on file   Social Determinants of Health   Financial Resource Strain: Not on file  Food Insecurity: Not on file  Transportation Needs: Not on file  Physical Activity: Not on file  Stress: Not on file  Social Connections: Not on file    Labs: No results found for: HIV1RNAQUANT, HIV1RNAVL, CD4TABS  RPR and STI No results found for: LABRPR, RPRTITER  No flowsheet data found.  Hepatitis B No results found for: HEPBSAB, HEPBSAG, HEPBCAB Hepatitis C No results found for: HEPCAB, HCVRNAPCRQN Hepatitis A No results found for: HAV Lipids: No results found for: CHOL, TRIG, HDL, CHOLHDL, VLDL,  LDLCALC   Assessment: Collin Thomas presents to the clinic to establish care for their newly diagnosed HIV infection. Patient is acutely infected and tested positive via 4th gen antibody on 05/04/21. HIV 1/2 antibody test was non-reactive for HIV-1 Ab and indeterminate for HIV-2 Ab. HIV 1/2 qualitative RNA was reactive for HIV-1 and non-reactive for HIV-2. Possibility of false positive, but will empirically start [name] to avoid withholding therapy. No labs available yet but will get them drawn today to confirm. Medications reviewed; no drug interactions were found. Will start [name].  Explained that Susanne Borders is a one pill once daily medication with or without food and the importance of not missing any doses. Explained resistance and how it develops and why it is so important to take [name] daily and not skip days or doses. Counseled patient to take it around the same time each day. Counseled on what to do if dose is missed, if closer to missed dose take immediately, if closer to next dose then skip and resume normal schedule.   Cautioned on possible side effects the first week or so including nausea, diarrhea, dizziness, and headaches but that they should resolve after the first couple of weeks. Counseled patient to separate [name] from divalent cations including multivitamins. Discussed with patient to call clinic if he starts a new medication or herbal supplement. I gave  the patient my card and told him to call me with any issues/questions/concerns.  [name] samples for 2 weeks were provided to patient. They are currently insured.  Plan: - [name] rapid start - Will follow up on lab work  Josie Dixon Gregary Signs) Khristian Seals, PharmD Student  05/10/2021, 4:29 PM

## 2021-05-10 NOTE — Progress Notes (Signed)
Medication Samples have been provided to the patient.  Drug name: [name]        Strength: 50/200/25 mg       Qty: 14 tablets (2 bottles) LOT: [id number]   Exp.Date: 01/14/23  Dosing instructions: Take one tablet by mouth once daily  The patient has been instructed regarding the correct time, dose, and frequency of taking this medication, including desired effects and most common side effects.   Alfonse Spruce, PharmD, CPP Clinical Pharmacist Practitioner Infectious [place] for Infectious Disease

## 2021-05-10 NOTE — Patient Instructions (Signed)
We are repeating labs today to confirm or dispute HIV infection  Screening today for other STI  Start taking [name] in case you have acute HIV.  If you end up not having HIV, we will plan to change this to a prevention regimen.  Follow up in 1 week

## 2021-05-10 NOTE — Telephone Encounter (Signed)
[facility] Patient Product/process development scientist completed.    The patient is insured through [employer] and has a $30.00 copay.  Medication will need a copay coupon card  We will continue to follow to see if copay assistance is needed.  Clearance Coots, CPhT Specialty Pharmacy Patient [facility] for Infectious Disease Phone: [phone] Fax:  [phone] [facility]

## 2021-05-10 NOTE — Progress Notes (Addendum)
[place] for Infectious Disease  Reason for Consult: HIV new patient Referring Provider: ED  Chief Complaint  Patient presents with   New Patient (Initial Visit)    B20 - pt reports feeling fatigue.     HPI:    Collin Thomas is a 41 y.o. male with a past medical history as below who presents to clinic as a new patient for HIV care.    Patient was recently seen in the ED on 05/04/21 for fatigue and near syncope. There was concern for possible acute HIV which prompted HIV testing.  The results of his HIV testing are as follows: --4th generation screening reactive --HIV 1 Ab non-reactive --HIV 2 Ab indeterminate --HIV-1 RNA reactive,  --HIV-2 RNA non-reactive --However, in the final interpretation of lab results it reports the testing algorithm was not completed and infection unconfirmed as RNA testing could not be completed.  This has led to some confusion.  Patient has HIV risk factors of MSM.  Reports inconsistent condom use.  Reports ~10 new partners in the past 12 months.  In May 2022 he was seen at urgent care after sex with an HIV positive partner.  He was prescribed PEP at that time with Truvada and Tivicay.  He did not complete the whole duration of therapy due to GI upset.  Testing at that time indicated negative HIV 4th generation screen.  He also was given a prescription for Truvada 30 day supply in August 2022 by his PCP after a negative HIV test again.  However, he never picked up the prescription.  He has not been on any PrEP otherwise.  He more recently had a negative HIV test in October at [facility].  He also reports a history of gonorrhea, syphilis, and herpes that has been treated.   Patient's Medications  New Prescriptions   No medications on file  Previous Medications   MAGIC MOUTHWASH (LIDOCAINE, DIPHENHYDRAMINE, ALUM & MAG HYDROXIDE) SUSPENSION    Swish and spit 5 mLs 3 (three) times daily as needed for mouth pain.  Modified Medications   No  medications on file  Discontinued Medications   ALBUTEROL (VENTOLIN HFA) 108 (90 BASE) MCG/ACT INHALER    Inhale 2 puffs into the lungs every 6 (six) hours as needed for wheezing or shortness of breath.   DEXTROMETHORPHAN-GUAIFENESIN (MUCINEX DM) 30-600 MG TB12    Take 1 tablet by mouth 2 (two) times daily as needed (for congestion/cold-like symptoms).   DOXYCYCLINE (VIBRA-TABS) 100 MG TABLET    Take 100 mg by mouth 2 (two) times daily.   MONTELUKAST (SINGULAIR) 10 MG TABLET    Take 10 mg by mouth every evening.   VALACYCLOVIR (VALTREX) 500 MG TABLET    Take 500 mg by mouth 2 (two) times daily.      Past Medical History:  Diagnosis Date   Gonorrhea    Herpes    Syphilis     Social History   Tobacco Use   Smoking status: Every Day   Smokeless tobacco: Never  Substance Use Topics   Alcohol use: Yes   Drug use: Yes    Types: Marijuana    History reviewed. No pertinent family history.  No Known Allergies  Review of Systems  Constitutional:  Positive for malaise/fatigue.  Respiratory: Negative.    Cardiovascular: Negative.   Gastrointestinal: Negative.   Genitourinary: Negative.     OBJECTIVE:    Vitals:   05/10/21 1533  BP: 106/67  Pulse:  84  Resp: 16  Temp: 99.3 F (37.4 C)  TempSrc: Oral  SpO2: 98%  Weight: 153 lb (69.4 kg)  Height: 6\' 3"  (1.905 m)     Body mass index is 19.12 kg/m.   Physical Exam Constitutional:      General: He is not in acute distress.    Appearance: Normal appearance.  HENT:     Head: Normocephalic and atraumatic.  Eyes:     Extraocular Movements: Extraocular movements intact.     Conjunctiva/sclera: Conjunctivae normal.  Pulmonary:     Effort: Pulmonary effort is normal. No respiratory distress.  Abdominal:     General: There is no distension.     Palpations: Abdomen is soft.  Musculoskeletal:        General: Normal range of motion.  Skin:    General: Skin is warm and dry.     Findings: No rash.  Neurological:      General: No focal deficit present.     Mental Status: He is alert and oriented to person, place, and time.  Psychiatric:        Mood and Affect: Mood normal.        Behavior: Behavior normal.    Labs and Microbiology: CMP Latest Ref Rng & Units 05/04/2021 07/23/2020  Glucose 70 - 99 mg/dL 93 111(H)  BUN 6 - 20 mg/dL 11 10  Creatinine 0.61 - 1.24 mg/dL 1.10 1.20  Sodium 135 - 145 mmol/L 130(L) 135  Potassium 3.5 - 5.1 mmol/L 4.0 3.6  Chloride 98 - 111 mmol/L 99 103  CO2 22 - 32 mmol/L 21(L) 22  Calcium 8.9 - 10.3 mg/dL 8.8(L) 9.4  Total Protein 6.5 - 8.1 g/dL 6.9 -  Total Bilirubin 0.3 - 1.2 mg/dL 0.6 -  Alkaline Phos 38 - 126 U/L 36(L) -  AST 15 - 41 U/L 37 -  ALT 0 - 44 U/L 26 -   CBC Latest Ref Rng & Units 05/04/2021 07/23/2020  WBC 4.0 - 10.5 K/uL 3.9(L) 10.3  Hemoglobin 13.0 - 17.0 g/dL 15.0 14.6  Hematocrit 39.0 - 52.0 % 44.3 41.8  Platelets 150 - 400 K/uL 135(L) 276        ASSESSMENT & PLAN:    HIV test positive [facility]) Discussed with patient his HIV testing is inconsistent and needs to be repeated for further clarification to determine if he has acute infection given the unusual lab report in Epic.  However, he does have risk factors for HIV acquisition so there is a decent chance this is truly positive.  Will repeat 4th generation test and HIV viral load today.  RTC 1 week.  Given his high risk sexual behavior will also check hepatitis serologies, GC/CT x 3, and RPR.  Will start Robinson for now pending work up.  If he is determined to not have HIV will plan to just continue Descovy for PrEP at that point.     Orders Placed This Encounter  Procedures   Hepatitis B surface antigen   Hepatitis B surface antibody,qualitative   Hepatitis B core antibody, total   Hepatitis C antibody   HIV Antibody (routine testing w rflx)   HIV-1 RNA quant-no reflex-bld   RPR   HIV-1 Genotyping (RTI,PI,IN Inhbtr)   Hepatitis A antibody, total   T-helper cell (CD4)- ([facility] clinic only)       [name] for Infectious Disease [facility] 05/10/2021, 4:15 PM   I spent 60 minutes dedicated to the care of  this patient on the date of this encounter to include pre-visit review of records, face-to-face time with the patient discussing HIV and high risk sexual behavior, and post-visit ordering of testing.

## 2021-05-10 NOTE — Assessment & Plan Note (Signed)
Discussed with patient his HIV testing is inconsistent and needs to be repeated for further clarification to determine if he has acute infection given the unusual lab report in Epic.  However, he does have risk factors for HIV acquisition so there is a decent chance this is truly positive.  Will repeat 4th generation test and HIV viral load today.  RTC 1 week.  Given his high risk sexual behavior will also check hepatitis serologies, GC/CT x 3, and RPR.  Will start [name] for now pending work up.  If he is determined to not have HIV will plan to just continue Descovy for PrEP at that point.

## 2021-05-11 LAB — URINE CYTOLOGY ANCILLARY ONLY
Chlamydia: NEGATIVE
Comment: NEGATIVE
Comment: NORMAL
Neisseria Gonorrhea: NEGATIVE

## 2021-05-11 LAB — CYTOLOGY, (ORAL, ANAL, URETHRAL) ANCILLARY ONLY
Chlamydia: NEGATIVE
Chlamydia: NEGATIVE
Comment: NEGATIVE
Comment: NEGATIVE
Comment: NORMAL
Comment: NORMAL
Neisseria Gonorrhea: NEGATIVE
Neisseria Gonorrhea: NEGATIVE

## 2021-05-14 ENCOUNTER — Ambulatory Visit: Payer: BC Managed Care – PPO | Admitting: Pharmacist

## 2021-05-14 ENCOUNTER — Ambulatory Visit: Payer: BC Managed Care – PPO | Admitting: Internal Medicine

## 2021-05-17 ENCOUNTER — Other Ambulatory Visit: Payer: Self-pay

## 2021-05-17 ENCOUNTER — Other Ambulatory Visit: Payer: Self-pay | Admitting: Pharmacist

## 2021-05-17 ENCOUNTER — Encounter: Payer: Self-pay | Admitting: Internal Medicine

## 2021-05-17 ENCOUNTER — Other Ambulatory Visit (HOSPITAL_COMMUNITY): Payer: Self-pay

## 2021-05-17 ENCOUNTER — Ambulatory Visit: Payer: BC Managed Care – PPO | Admitting: Internal Medicine

## 2021-05-17 VITALS — BP 108/72 | HR 67 | Temp 98.0°F | Wt 152.0 lb

## 2021-05-17 DIAGNOSIS — Z113 Encounter for screening for infections with a predominantly sexual mode of transmission: Secondary | ICD-10-CM

## 2021-05-17 DIAGNOSIS — H938X9 Other specified disorders of ear, unspecified ear: Secondary | ICD-10-CM | POA: Insufficient documentation

## 2021-05-17 DIAGNOSIS — B20 Human immunodeficiency virus [HIV] disease: Secondary | ICD-10-CM | POA: Diagnosis not present

## 2021-05-17 DIAGNOSIS — Z5181 Encounter for therapeutic drug level monitoring: Secondary | ICD-10-CM

## 2021-05-17 DIAGNOSIS — Z7185 Encounter for immunization safety counseling: Secondary | ICD-10-CM | POA: Diagnosis not present

## 2021-05-17 DIAGNOSIS — A539 Syphilis, unspecified: Secondary | ICD-10-CM | POA: Diagnosis not present

## 2021-05-17 DIAGNOSIS — Z23 Encounter for immunization: Secondary | ICD-10-CM

## 2021-05-17 DIAGNOSIS — H938X2 Other specified disorders of left ear: Secondary | ICD-10-CM

## 2021-05-17 HISTORY — DX: Other specified disorders of ear, unspecified ear: H93.8X9

## 2021-05-17 HISTORY — DX: Encounter for therapeutic drug level monitoring: Z51.81

## 2021-05-17 HISTORY — DX: Encounter for immunization safety counseling: Z71.85

## 2021-05-17 HISTORY — DX: Encounter for screening for infections with a predominantly sexual mode of transmission: Z11.3

## 2021-05-17 MED ORDER — BICTEGRAVIR-EMTRICITAB-TENOFOV 50-200-25 MG PO TABS
1.0000 | ORAL_TABLET | Freq: Every day | ORAL | 5 refills | Status: DC
Start: 2021-05-17 — End: 2021-06-21
  Filled 2021-05-17 – 2021-05-18 (×2): qty 30, 30d supply, fill #0

## 2021-05-17 MED ORDER — BIKTARVY 50-200-25 MG PO TABS: 1.0000 | ORAL_TABLET | Freq: Every day | ORAL | 0 refills | Status: AC

## 2021-05-17 NOTE — Progress Notes (Signed)
[facility]   CHIEF COMPLAINT    HIV follow up.   Chief Complaint  Patient presents with   Follow-up    B20      SUBJECTIVE:    Collin Thomas is a 41 y.o. male with PMHx as below who presents to the clinic for HIV follow up.   Please see A&P for the details of today's visit and status of the patient's medical problems.   Patient's Medications  New Prescriptions   No medications on file  Previous Medications   No medications on file  Modified Medications   Modified Medication Previous Medication   BICTEGRAVIR-EMTRICITABINE-TENOFOVIR AF ([name]) 50-200-25 MG TABS TABLET bictegravir-emtricitabine-tenofovir AF ([name]) 50-200-25 MG TABS tablet      Take 1 tablet by mouth daily for 180 doses.    Take 1 tablet by mouth daily for 14 days.  Discontinued Medications   MAGIC MOUTHWASH (LIDOCAINE, DIPHENHYDRAMINE, ALUM & MAG HYDROXIDE) SUSPENSION    Swish and spit 5 mLs 3 (three) times daily as needed for mouth pain.      Past Medical History:  Diagnosis Date   Gonorrhea    Herpes    Syphilis     Social History   Tobacco Use   Smoking status: Every Day   Smokeless tobacco: Never  Substance Use Topics   Alcohol use: Yes   Drug use: Not Currently    No family history on file.  No Known Allergies  Review of Systems  Constitutional:  Positive for malaise/fatigue and weight loss. Negative for chills and fever.  HENT:  Positive for congestion and ear pain.   Gastrointestinal: Negative.   Genitourinary: Negative.     OBJECTIVE:    Vitals:   05/17/21 1407  BP: 108/72  Pulse: 67  Temp: 98 F (36.7 C)  TempSrc: Oral  SpO2: (!) 67%  Weight: 152 lb (68.9 kg)     Body mass index is 19 kg/m.  Physical Exam Constitutional:      General: He is not in acute distress.    Appearance: Normal appearance.  HENT:     Head: Normocephalic and atraumatic.     Right Ear: Tympanic membrane and external ear normal.     Left  Ear: External ear normal.     Ears:     Comments: Left TM with some fluid behind but no obvious purulence. Eyes:     Extraocular Movements: Extraocular movements intact.     Conjunctiva/sclera: Conjunctivae normal.  Pulmonary:     Effort: Pulmonary effort is normal. No respiratory distress.  Skin:    General: Skin is warm and dry.     Findings: No rash.     Comments: Small lesion noted on nose.  Neurological:     General: No focal deficit present.     Mental Status: He is alert and oriented to person, place, and time.  Psychiatric:        Mood and Affect: Mood normal.        Behavior: Behavior normal.    Labs and Microbiology: CMP Latest Ref Rng & Units 05/04/2021 07/23/2020  Glucose 70 - 99 mg/dL 93 111(H)  BUN 6 - 20 mg/dL 11 10  Creatinine 0.61 - 1.24 mg/dL 1.10 1.20  Sodium 135 - 145 mmol/L 130(L) 135  Potassium 3.5 - 5.1 mmol/L 4.0 3.6  Chloride 98 - 111 mmol/L 99 103  CO2 22 - 32 mmol/L 21(L) 22  Calcium 8.9 - 10.3  mg/dL 8.8(L) 9.4  Total Protein 6.5 - 8.1 g/dL 6.9 -  Total Bilirubin 0.3 - 1.2 mg/dL 0.6 -  Alkaline Phos 38 - 126 U/L 36(L) -  AST 15 - 41 U/L 37 -  ALT 0 - 44 U/L 26 -   CBC Latest Ref Rng & Units 05/04/2021 07/23/2020  WBC 4.0 - 10.5 K/uL 3.9(L) 10.3  Hemoglobin 13.0 - 17.0 g/dL 15.0 14.6  Hematocrit 39.0 - 52.0 % 44.3 41.8  Platelets 150 - 400 K/uL 135(L) 276     Lab Results  Component Value Date   HIV1RNAQUANT 6,880,000 (H) 05/10/2021    RPR and STI: Lab Results  Component Value Date   LABRPR REACTIVE (A) 05/10/2021   RPRTITER 1:2 (H) 05/10/2021    STI Results GC CT  05/10/2021 Negative Negative  05/10/2021 Negative Negative  05/10/2021 Negative Negative    Hepatitis B: Lab Results  Component Value Date   HEPBSAB NON-REACTIVE 05/10/2021   HEPBSAG NON-REACTIVE 05/10/2021   HEPBCAB NON-REACTIVE 05/10/2021   Hepatitis C: Lab Results  Component Value Date   HEPCAB NON-REACTIVE 05/10/2021   Hepatitis A: Lab Results  Component Value  Date   HAV NON-REACTIVE 05/10/2021     ASSESSMENT & PLAN:    HIV disease ([place]) His labs last week confirmed acute HIV infection with reactive HIV 4th generation screening, indeterminate HIV-1 Ab, and high viral load of 6,880,000 copies.  He has been adherent to his [name] that was provided to him.  Will continue with this and send refills to [facility].  Will repeat CMP, Viral Load, and check CD4 today (not drawn last week in lab).  Will also complete remainder of intake B20 labs.  RTC in 6 weeks.    Vaccine counseling Will give hepatitis A, hepatitis B first doses today.  Routine screening for STI (sexually transmitted infection) Screening for GC/CT last week x 3 was negative.   Syphilis Patient with RPR 1:2.  Discussed with health department.  Patient has history of syphilis titer 1:128 in Feb 2021 treated with Bicillin.  Repeat titer done October 2022 was 1:1.  No need for further treatment at this time.   Encounter for medication monitoring Check CMP, UA, lipids today.  Ear fullness Notes some fullness to his ear but no hearing loss.  Has f/u with ENT next week.  Discussed no evidence of infection right now but has some fluid behind TM.  Advised Flonase and decongestant for now.   Orders Placed This Encounter  Procedures   QuantiFERON-TB Gold Plus   Urinalysis, Routine w reflex microscopic   Lipid panel   HIV-1 RNA quant-no reflex-bld   T-helper cells (CD4) count (not at [facility])   [facility] 05/17/2021, 2:42 PM

## 2021-05-17 NOTE — Assessment & Plan Note (Signed)
His labs last week confirmed acute HIV infection with reactive HIV 4th generation screening, indeterminate HIV-1 Ab, and high viral load of 6,880,000 copies.  He has been adherent to his [name] that was provided to him.  Will continue with this and send refills to [facility].  Will repeat CMP, Viral Load, and check CD4 today (not drawn last week in lab).  Will also complete remainder of intake B20 labs.  RTC in 6 weeks.

## 2021-05-17 NOTE — Assessment & Plan Note (Signed)
Patient with RPR 1:2.  Discussed with health department.  Patient has history of syphilis titer 1:128 in Feb 2021 treated with Bicillin.  Repeat titer done October 2022 was 1:1.  No need for further treatment at this time.

## 2021-05-17 NOTE — Addendum Note (Signed)
Addended by: Marcell Anger on: 05/17/2021 03:37 PM   Modules accepted: Orders

## 2021-05-17 NOTE — Progress Notes (Signed)
Medication Samples have been provided to the patient. ° °Drug name: [name]        °Strength: 50/200/25 mg       °Qty: 7 tablets (1 bottle)   °LOT: [id number]   °Exp.Date: 01/14/23 ° °Dosing instructions: Take one tablet by mouth once daily ° °The patient has been instructed regarding the correct time, dose, and frequency of taking this medication, including desired effects and most common side effects.  ° °Jawaan Adachi L. Addisynn Vassell, PharmD, BCIDP, AAHIVP, CPP °Clinical Pharmacist Practitioner °Infectious Diseases Clinical Pharmacist °[facility] °03/27/2020, 10:07 AM ° °

## 2021-05-17 NOTE — Assessment & Plan Note (Signed)
Will give hepatitis A, hepatitis B first doses today.

## 2021-05-17 NOTE — Assessment & Plan Note (Signed)
Screening for GC/CT last week x 3 was negative.

## 2021-05-17 NOTE — Assessment & Plan Note (Signed)
Check CMP, UA, lipids today.

## 2021-05-17 NOTE — Assessment & Plan Note (Signed)
Notes some fullness to his ear but no hearing loss.  Has f/u with ENT next week.  Discussed no evidence of infection right now but has some fluid behind TM.  Advised Flonase and decongestant for now.

## 2021-05-18 ENCOUNTER — Telehealth: Payer: Self-pay

## 2021-05-18 ENCOUNTER — Encounter: Payer: Self-pay | Admitting: Internal Medicine

## 2021-05-18 ENCOUNTER — Other Ambulatory Visit (HOSPITAL_COMMUNITY): Payer: Self-pay

## 2021-05-18 NOTE — Telephone Encounter (Signed)
[facility] Patient Advocate Encounter °  °Was successful in obtaining a [employer] copay card for [name].  This copay card will make the patients copay 0.00. ° °I have spoken with the patient.   ° °The billing information is as follows and has been shared with [facility]. ° ° ° ° ° ° ° °Collin Thomas, CPhT °Specialty Pharmacy Patient Advocate °[facility] °Phone: [phone] °Fax:  [phone]  °

## 2021-05-21 LAB — LIPID PANEL
Cholesterol: 163 mg/dL (ref <200)
HDL: 29 mg/dL — ABNORMAL LOW (ref >=40)
LDL Cholesterol (Calc): 108 mg/dL (calc) — ABNORMAL HIGH
Non-HDL Cholesterol (Calc): 134 mg/dL (calc) — ABNORMAL HIGH (ref <130)
Total CHOL/HDL Ratio: 5.6 (calc) — ABNORMAL HIGH (ref <5.0)
Triglycerides: 138 mg/dL (ref <150)

## 2021-05-21 LAB — COMPLETE METABOLIC PANEL WITH GFR
AG Ratio: 1.4 (calc) (ref 1.0–2.5)
ALT: 22 U/L (ref 9–46)
AST: 20 U/L (ref 10–40)
Albumin: 4.2 g/dL (ref 3.6–5.1)
Alkaline phosphatase (APISO): 45 U/L (ref 36–130)
BUN/Creatinine Ratio: 13 (calc) (ref 6–22)
BUN: 17 mg/dL (ref 7–25)
CO2: 26 mmol/L (ref 20–32)
Calcium: 9.5 mg/dL (ref 8.6–10.3)
Chloride: 107 mmol/L (ref 98–110)
Creat: 1.28 mg/dL — ABNORMAL HIGH (ref 0.60–1.26)
Globulin: 3.1 g/dL (calc) (ref 1.9–3.7)
Glucose, Bld: 88 mg/dL (ref 65–99)
Potassium: 4.2 mmol/L (ref 3.5–5.3)
Sodium: 141 mmol/L (ref 135–146)
Total Bilirubin: 0.6 mg/dL (ref 0.2–1.2)
Total Protein: 7.3 g/dL (ref 6.1–8.1)
eGFR: 73 mL/min/{1.73_m2} (ref >=60)

## 2021-05-21 LAB — QUANTIFERON-TB GOLD PLUS
Mitogen-NIL: >10 IU/mL
NIL: 0.08 IU/mL
QuantiFERON-TB Gold Plus: NEGATIVE
TB1-NIL: 0.02 IU/mL
TB2-NIL: 0.02 IU/mL

## 2021-05-21 LAB — URINALYSIS, ROUTINE W REFLEX MICROSCOPIC
Bilirubin Urine: NEGATIVE
Glucose, UA: NEGATIVE
Hgb urine dipstick: NEGATIVE
Ketones, ur: NEGATIVE
Leukocytes,Ua: NEGATIVE
Nitrite: NEGATIVE
Protein, ur: NEGATIVE
Specific Gravity, Urine: 1.025 (ref 1.001–1.035)
pH: 5.5 (ref 5.0–8.0)

## 2021-05-21 LAB — T-HELPER CELLS (CD4) COUNT (NOT AT ARMC)
Absolute CD4: 610 cells/uL (ref 490–1740)
CD4 T Helper %: 39 % (ref 30–61)
Total lymphocyte count: 1550 cells/uL (ref 850–3900)

## 2021-05-21 LAB — HIV-1 RNA QUANT-NO REFLEX-BLD
HIV 1 RNA Quant: 21400 Copies/mL — ABNORMAL HIGH
HIV-1 RNA Quant, Log: 4.33 Log cps/mL — ABNORMAL HIGH

## 2021-05-23 NOTE — Progress Notes (Signed)
Message sent to patient via mychart

## 2021-05-24 LAB — HEPATITIS B SURFACE ANTIBODY,QUALITATIVE: Hep B S Ab: NONREACTIVE

## 2021-05-24 LAB — HEPATITIS C ANTIBODY
Hepatitis C Ab: NONREACTIVE
SIGNAL TO CUT-OFF: <0.02 (ref <1.00)

## 2021-05-24 LAB — HEPATITIS B SURFACE ANTIGEN: Hepatitis B Surface Ag: NONREACTIVE

## 2021-05-24 LAB — HIV-1 GENOTYPING (RTI,PI,IN INHBTR): HIV-1 Genotype: DETECTED — AB

## 2021-05-24 LAB — HEPATITIS B CORE ANTIBODY, TOTAL: Hep B Core Total Ab: NONREACTIVE

## 2021-05-24 LAB — HIV 1 RNA, QL RT PCR: HIV-1 RNA, Qualitative, TMA: DETECTED — AB

## 2021-05-24 LAB — HIV-1/2 AB - DIFFERENTIATION
HIV-1 antibody: UNDETERMINED — AB
HIV-2 Ab: NEGATIVE

## 2021-05-24 LAB — HIV ANTIBODY (ROUTINE TESTING W REFLEX): HIV 1&2 Ab, 4th Generation: REACTIVE — AB

## 2021-05-24 LAB — RPR TITER: RPR Titer: 1:2 {titer} — ABNORMAL HIGH

## 2021-05-24 LAB — HIV-1 RNA QUANT-NO REFLEX-BLD
HIV 1 RNA Quant: 6880000 Copies/mL — ABNORMAL HIGH
HIV-1 RNA Quant, Log: 6.84 Log cps/mL — ABNORMAL HIGH

## 2021-05-24 LAB — FLUORESCENT TREPONEMAL AB(FTA)-IGG-BLD: Fluorescent Treponemal ABS: REACTIVE — AB

## 2021-05-24 LAB — HEPATITIS A ANTIBODY, TOTAL: Hepatitis A AB,Total: NONREACTIVE

## 2021-05-24 LAB — RPR: RPR Ser Ql: REACTIVE — AB

## 2021-05-25 ENCOUNTER — Telehealth: Payer: Self-pay | Admitting: Pharmacist

## 2021-05-25 NOTE — Telephone Encounter (Signed)
Patient called very concerned about his HIV genotype results. Discussed that the "abnormal" result showing "detected" virus was from January prior to starting treatment, and reviewed that his HIV is wildtype without any drug resistance at this time. Explained that he has experienced an excellent decline in his viral load since starting treatment and that is immune system is in great shape.  He is also concerned that his employer may have repercussions for how much his health insurance is charging for his care in comparison to his salary. Discussed extensively that there should be laws in place that prohibit employers from discriminating against patients with high medical bills. Also reviewed that his Phillips Odor is completely free with the [place] assistance.   Collin Thomas requested that he switch to RAL/FTC/TDF individual components once he is undetectable in order to have more affordable medications stating he does not care if he has to take more than one pill per day. He says this would theoretically cut his monthly "cost" to his employer in half. Explained again that  his health insurance should have no impact on his employment. Reviewed that raltegravir has to be taken BID despite FTC and TDF being once daily and that there is higher risk of error with adherence. Discussed that raltegravir is not as robust as bictegravir, and we would prefer him remain on a second-generation INSTI such as bictegravir rather than raltegravir. He is adamant about this switch, but I encouraged him that Phillips Odor is the best regimen for him. He would like to review this switch with Dr. Juleen China at his upcoming appointment in March.   Please advise prior to the appointment if you have additional thoughts. I think he simply needs reinforcement that his employer cannot discriminate against him due to his health insurance charges and that he is on the best regimen he can possibly be on. Thanks!  Alfonse Spruce, PharmD, CPP Clinical  Pharmacist Practitioner Infectious [facility]

## 2021-06-06 ENCOUNTER — Other Ambulatory Visit (HOSPITAL_COMMUNITY): Payer: Self-pay

## 2021-06-08 ENCOUNTER — Other Ambulatory Visit (HOSPITAL_COMMUNITY): Payer: Self-pay

## 2021-06-17 ENCOUNTER — Encounter (HOSPITAL_COMMUNITY): Payer: Self-pay | Admitting: Emergency Medicine

## 2021-06-17 ENCOUNTER — Emergency Department (HOSPITAL_COMMUNITY)
Admission: EM | Admit: 2021-06-17 | Discharge: 2021-06-17 | Disposition: A | Discharge status: Home / Self Care | Payer: BC Managed Care – PPO | Attending: Emergency Medicine | Admitting: Emergency Medicine

## 2021-06-17 ENCOUNTER — Other Ambulatory Visit: Payer: Self-pay

## 2021-06-17 ENCOUNTER — Emergency Department (HOSPITAL_BASED_OUTPATIENT_CLINIC_OR_DEPARTMENT_OTHER): Payer: BC Managed Care – PPO

## 2021-06-17 DIAGNOSIS — Z21 Asymptomatic human immunodeficiency virus [HIV] infection status: Secondary | ICD-10-CM | POA: Insufficient documentation

## 2021-06-17 DIAGNOSIS — M79661 Pain in right lower leg: Secondary | ICD-10-CM

## 2021-06-17 DIAGNOSIS — M79604 Pain in right leg: Secondary | ICD-10-CM | POA: Insufficient documentation

## 2021-06-17 LAB — BASIC METABOLIC PANEL
Anion gap: 9 (ref 5–15)
BUN: 15 mg/dL (ref 6–20)
CO2: 23 mmol/L (ref 22–32)
Calcium: 9.1 mg/dL (ref 8.9–10.3)
Chloride: 105 mmol/L (ref 98–111)
Creatinine, Ser: 1.28 mg/dL — ABNORMAL HIGH (ref 0.61–1.24)
GFR, Estimated: >60 mL/min (ref >60)
Glucose, Bld: 101 mg/dL — ABNORMAL HIGH (ref 70–99)
Potassium: 4 mmol/L (ref 3.5–5.1)
Sodium: 137 mmol/L (ref 135–145)

## 2021-06-17 LAB — CBC WITH DIFFERENTIAL/PLATELET
Abs Immature Granulocytes: 0.01 10*3/uL (ref 0.00–0.07)
Basophils Absolute: 0 10*3/uL (ref 0.0–0.1)
Basophils Relative: 1 %
Eosinophils Absolute: 0.1 10*3/uL (ref 0.0–0.5)
Eosinophils Relative: 1 %
HCT: 41.9 % (ref 39.0–52.0)
Hemoglobin: 14.6 g/dL (ref 13.0–17.0)
Immature Granulocytes: 0 %
Lymphocytes Relative: 31 %
Lymphs Abs: 1.9 10*3/uL (ref 0.7–4.0)
MCH: 32.2 pg (ref 26.0–34.0)
MCHC: 34.8 g/dL (ref 30.0–36.0)
MCV: 92.3 fL (ref 80.0–100.0)
Monocytes Absolute: 0.5 10*3/uL (ref 0.1–1.0)
Monocytes Relative: 9 %
Neutro Abs: 3.5 10*3/uL (ref 1.7–7.7)
Neutrophils Relative %: 58 %
Platelets: 249 10*3/uL (ref 150–400)
RBC: 4.54 MIL/uL (ref 4.22–5.81)
RDW: 13.2 % (ref 11.5–15.5)
WBC: 6 10*3/uL (ref 4.0–10.5)
nRBC: 0 % (ref 0.0–0.2)

## 2021-06-17 LAB — MAGNESIUM: Magnesium: 2 mg/dL (ref 1.7–2.4)

## 2021-06-17 NOTE — ED Notes (Signed)
Reviewed discharge instructions with patient. Follow-up care reviewed. Patient verbalized understanding. Patient A&Ox4, VSS, and ambulatory with steady gait upon discharge.  

## 2021-06-17 NOTE — ED Triage Notes (Signed)
R calf pain x 3 days.  Denies redness/swelling.  No known injury. ?

## 2021-06-17 NOTE — ED Provider Notes (Signed)
?[facility] ?Provider Note ? ? ?CSN: [id number] ?Arrival date & time: 06/17/21  [id number] ? ?  ? ?History ? ?Chief Complaint  ?Patient presents with  ? Leg Pain  ? ? ?Collin Thomas is a 41 y.o. male. ? ?41 year old male with past medical history of HIV (CD4 610 05/17/21), presents with complaint of right calf pain without injury, redness, swelling.  Patient states that he had a knot in his right posterior knee area which he rubbed for the past couple days and now has a constant charley horse in his right calf, concerned he has a DVT.  No history of personal or family history of DVT or PE. ? ? ?  ? ?Home Medications ?Prior to Admission medications   ?Medication Sig Start Date End Date Taking? Authorizing Provider  ?bictegravir-emtricitabine-tenofovir AF ([name]) 50-200-25 MG TABS tablet Take 1 tablet by mouth daily 05/17/21 11/13/21 Yes Kathlynn GrateWallace, Andrew N, DO  ?Homeopathic Products (EAR PAIN RELIEF HOMEOPATHIC OT) Place 5 drops into the left ear daily as needed (ear pain).   Yes [provider]  ?valACYclovir (VALTREX) 500 MG tablet Take 500 mg by mouth 2 (two) times daily as needed (outbreaks). 07/19/20  Yes [provider]  ?   ? ?Allergies    ?Patient has no known allergies.   ? ?Review of Systems   ?Review of Systems ?Negative except as per HPI ?Physical Exam ?Updated Vital Signs ?BP 124/87 (BP Location: Right Arm)   Pulse 69   Temp 98.2 ?F (36.8 ?C) (Oral)   Resp 17   SpO2 99%  ?Physical Exam ?Vitals and nursing note reviewed.  ?Constitutional:   ?   General: He is not in acute distress. ?   Appearance: He is well-developed. He is not diaphoretic.  ?HENT:  ?   Head: Normocephalic and atraumatic.  ?Cardiovascular:  ?   Pulses: Normal pulses.  ?Pulmonary:  ?   Effort: Pulmonary effort is normal.  ?Musculoskeletal:     ?   General: Tenderness present. No swelling, deformity or signs of injury.  ?   Right lower leg: No edema.  ?   Left lower leg: No edema.  ?    Comments: No swelling, no erythema, no palpable cords.  Pain in right calf is reproduced with flexion of right ankle.  ?Skin: ?   General: Skin is warm and dry.  ?   Findings: No erythema or rash.  ?Neurological:  ?   Mental Status: He is alert and oriented to person, place, and time.  ?   Motor: No weakness.  ?Psychiatric:     ?   Behavior: Behavior normal.  ? ? ?ED Results / Procedures / Treatments   ?Labs ?(all labs ordered are listed, but only abnormal results are displayed) ?Labs Reviewed  ?BASIC METABOLIC PANEL - Abnormal; Notable for the following components:  ?    Result Value  ? Glucose, Bld 101 (*)   ? Creatinine, Ser 1.28 (*)   ? All other components within normal limits  ?CBC WITH DIFFERENTIAL/PLATELET  ?MAGNESIUM  ? ? ?EKG ?None ? ?Radiology ?VAS US LOWER EXTREMITY VENOUS (DVT) (7a-7p) ? ?Result Date: 06/17/2021 ? Lower Venous DVT Study Patient Name:  Collin KiltsMATTHEW E Holloman  Date of Exam:   06/17/2021 Medical Rec #: [id number]           Accession #:    [id number] Date of Birth: 08/26/1980            Patient Gender: MJudie Petit  Patient Age:   7 years Exam Location:  [facility] Procedure:      VAS [place] LOWER EXTREMITY VENOUS (DVT) Referring Phys: Vernona Rieger Raelan Burgoon --------------------------------------------------------------------------------  Indications: Calf pain.  Comparison Study: No prior study Performing Technologist: Sherren Kerns RVS  Examination Guidelines: A complete evaluation includes B-mode imaging, spectral Doppler, color Doppler, and power Doppler as needed of all accessible portions of each vessel. Bilateral testing is considered an integral part of a complete examination. Limited examinations for reoccurring indications may be performed as noted. The reflux portion of the exam is performed with the patient in reverse Trendelenburg.  +---------+---------------+---------+-----------+----------+--------------+ RIGHT    CompressibilityPhasicitySpontaneityPropertiesThrombus Aging  +---------+---------------+---------+-----------+----------+--------------+ CFV      Full           Yes      Yes                                 +---------+---------------+---------+-----------+----------+--------------+ SFJ      Full                                                        +---------+---------------+---------+-----------+----------+--------------+ FV Prox  Full                                                        +---------+---------------+---------+-----------+----------+--------------+ FV Mid   Full                                                        +---------+---------------+---------+-----------+----------+--------------+ FV DistalFull                                                        +---------+---------------+---------+-----------+----------+--------------+ PFV      Full                                                        +---------+---------------+---------+-----------+----------+--------------+ POP      Full           Yes      Yes                                 +---------+---------------+---------+-----------+----------+--------------+ PTV      Full                                                        +---------+---------------+---------+-----------+----------+--------------+ PERO  Full                                                        +---------+---------------+---------+-----------+----------+--------------+ Gastroc  Full                                                        +---------+---------------+---------+-----------+----------+--------------+ GSV      Full                                                        +---------+---------------+---------+-----------+----------+--------------+   +----+---------------+---------+-----------+----------+--------------+ LEFTCompressibilityPhasicitySpontaneityPropertiesThrombus Aging  +----+---------------+---------+-----------+----------+--------------+ CFV Full           Yes      Yes                                 +----+---------------+---------+-----------+----------+--------------+    Summary: RIGHT: - There is no evidence of deep vein thrombosis in the lower extremity.  - No cystic structure found in the popliteal fossa.  LEFT: - No evidence of common femoral vein obstruction.  *See table(s) above for measurements and observations.    Preliminary    ? ?Procedures ?Procedures  ? ? ?Medications Ordered in ED ?Medications - No data to display ? ?ED Course/ Medical Decision Making/ A&P ?  ?                        ?Medical Decision Making ?Amount and/or Complexity of Data Reviewed ?Labs: ordered. ?ECG/medicine tests: ordered. ? ? ?This patient presents to the ED for concern of right leg pain, this involves an extensive number of treatment options, and is a complaint that carries with it a high risk of complications and morbidity.  The differential diagnosis includes but not limited to dehydration, electrolyte disturbance, DVT, muscle spasm ? ? ?Co morbidities that complicate the patient evaluation ? ?HIV ? ? ?Additional history obtained: ? ?External records from outside source obtained and reviewed including recent lab results for ID visit from February 2023 ? ? ?Lab Tests: ? ?I Ordered, and personally interpreted labs.  The pertinent results include: CBC, BMP, magnesium within normal notes. ? ? ?Imaging Studies ordered: ? ?I ordered imaging studies including venous Doppler right lower extremity ?Negative for DVT ? ? ? ?Problem List / ED Course: ? ?41 year old male presents with complaint of pain in his right calf as above.  On exam, he is found to have pain in his right calf with flexion of the foot, no palpable cords, erythema, edema.  Normal range of motion of the lower extremity.  Sensation intact, DP pulse present.  Labs reviewed and are unremarkable, potassium magnesium normal.   Venous duplex negative for DVT.  Recommend warm compress and gentle stretching, recheck with PCP. ? ? ? ? ? ? ? ? ?Final Clinical Impression(s) / ED Diagnoses ?Final diagnoses:  ?Right leg pain  ? ? ?Rx / DC Orders ?ED Discharge Orders   ? ?  None  ? ?  ? ? ?  ?Jeannie Fend, PA-C ?06/17/21 1217 ? ?  ?Wynetta Fines, MD ?06/17/21 1647 ? ?

## 2021-06-17 NOTE — Progress Notes (Signed)
VASCULAR LAB ? ? ? ?Right lower extremity venous duplex has been performed. ? ?See CV proc for preliminary results. ? ?Messaged results to Suella Broad, PA-C via secure chat ? ?Malya Cirillo, RVT ?06/17/2021, 9:24 AM ? ?

## 2021-06-21 ENCOUNTER — Other Ambulatory Visit (HOSPITAL_COMMUNITY): Payer: Self-pay

## 2021-06-21 ENCOUNTER — Encounter: Payer: Self-pay | Admitting: Internal Medicine

## 2021-06-21 ENCOUNTER — Other Ambulatory Visit: Payer: Self-pay

## 2021-06-21 ENCOUNTER — Ambulatory Visit: Payer: BC Managed Care – PPO | Admitting: Internal Medicine

## 2021-06-21 ENCOUNTER — Other Ambulatory Visit: Payer: Self-pay | Admitting: Pharmacist

## 2021-06-21 VITALS — BP 114/76 | HR 75 | Temp 97.7°F | Wt 162.0 lb

## 2021-06-21 DIAGNOSIS — Z7185 Encounter for immunization safety counseling: Secondary | ICD-10-CM | POA: Diagnosis not present

## 2021-06-21 DIAGNOSIS — B20 Human immunodeficiency virus [HIV] disease: Secondary | ICD-10-CM

## 2021-06-21 DIAGNOSIS — A539 Syphilis, unspecified: Secondary | ICD-10-CM | POA: Diagnosis not present

## 2021-06-21 DIAGNOSIS — L0100 Impetigo, unspecified: Secondary | ICD-10-CM | POA: Insufficient documentation

## 2021-06-21 DIAGNOSIS — Z5181 Encounter for therapeutic drug level monitoring: Secondary | ICD-10-CM

## 2021-06-21 DIAGNOSIS — Z23 Encounter for immunization: Secondary | ICD-10-CM | POA: Diagnosis not present

## 2021-06-21 DIAGNOSIS — Z113 Encounter for screening for infections with a predominantly sexual mode of transmission: Secondary | ICD-10-CM

## 2021-06-21 HISTORY — DX: Impetigo, unspecified: L01.00

## 2021-06-21 MED ORDER — BICTEGRAVIR-EMTRICITAB-TENOFOV 50-200-25 MG PO TABS: 1.0000 | ORAL_TABLET | Freq: Every day | ORAL | 5 refills | Status: DC

## 2021-06-21 MED ORDER — MUPIROCIN CALCIUM 2 % EX CREA
1.0000 | TOPICAL_CREAM | Freq: Two times a day (BID) | CUTANEOUS | 0 refills | Status: DC
Start: 2021-06-21 — End: 2022-04-22

## 2021-06-21 MED ORDER — BICTEGRAVIR-EMTRICITAB-TENOFOV 50-200-25 MG PO TABS: 1.0000 | ORAL_TABLET | Freq: Every day | ORAL | 0 refills | Status: AC

## 2021-06-21 NOTE — Assessment & Plan Note (Signed)
Collin Thomas given hepatitis B #2 today and Prevnar 20.  ?

## 2021-06-21 NOTE — Assessment & Plan Note (Signed)
Rash on left nares appears c/w impetigo.  Will prescribe mupirocin ointment for 5-7 days for empiric treatment.  ?

## 2021-06-21 NOTE — Patient Instructions (Signed)
Thank you for coming to see me today. It was a pleasure seeing you. ? ?To Do: ?Continue [name] ?Labs and vaccine today ?I sent Mupirocin for about 5-7 days to pharmacy for impetigo ? ?If you have any questions or concerns, please do not hesitate to call the office at [phone]. ? ?Take Care,  ? ?Jule Ser ? ?

## 2021-06-21 NOTE — Progress Notes (Signed)
?  ? ? ? ? ?[facility] ? ? ?CHIEF COMPLAINT   ? ?HIV follow up.   ? ?SUBJECTIVE:   ? ?Collin Thomas is a 40 y.o. male with PMHx as below who presents to the clinic for HIV follow up.  ? ?Please see A&P for the details of today's visit and status of the patient's medical problems.  ? ?Patient's Medications  ?New Prescriptions  ? MUPIROCIN CREAM (BACTROBAN) 2 %    Apply 1 application. topically 2 (two) times daily.  ?Previous Medications  ? BICTEGRAVIR-EMTRICITABINE-TENOFOVIR AF ([name]) 50-200-25 MG TABS TABLET    Take 1 tablet by mouth daily  ? HOMEOPATHIC PRODUCTS (EAR PAIN RELIEF HOMEOPATHIC OT)    Place 5 drops into the left ear daily as needed (ear pain).  ? VALACYCLOVIR (VALTREX) 500 MG TABLET    Take 500 mg by mouth 2 (two) times daily as needed (outbreaks).  ?Modified Medications  ? No medications on file  ?Discontinued Medications  ? No medications on file  ?   ? ?Past Medical History:  ?Diagnosis Date  ? Gonorrhea   ? Herpes   ? Syphilis   ? ? ?Social History  ? ?Tobacco Use  ? Smoking status: Every Day  ? Smokeless tobacco: Former  ?Substance Use Topics  ? Alcohol use: Yes  ?  Comment: occ  ? Drug use: Not Currently  ? ? ?No family history on file. ? ?No Known Allergies ? ?Review of Systems  ?All other systems reviewed and are negative. Except as noted in HPI or A&P. ? ? ?OBJECTIVE:   ? ?Vitals:  ? 06/21/21 1039  ?BP: 114/76  ?Pulse: 75  ?Temp: 97.7 ?F (36.5 ?C)  ?TempSrc: Oral  ?SpO2: 97%  ?Weight: 162 lb (73.5 kg)  ?   ?Body mass index is 20.25 kg/m?. ? ?Physical Exam ?Constitutional:   ?   General: He is not in acute distress. ?   Appearance: Normal appearance.  ?HENT:  ?   Head: Normocephalic and atraumatic.  ?Eyes:  ?   Extraocular Movements: Extraocular movements intact.  ?   Conjunctiva/sclera: Conjunctivae normal.  ?Pulmonary:  ?   Effort: Pulmonary effort is normal. No respiratory distress.  ?Abdominal:  ?   General: There is no distension.  ?   Palpations:  Abdomen is soft.  ?Musculoskeletal:     ?   General: Normal range of motion.  ?   Cervical back: Normal range of motion and neck supple.  ?Skin: ?   General: Skin is warm and dry.  ?   Findings: Lesion and rash present.  ?   Comments: Small area of crusted lesions on side of nose that appears c/w impetigo.  ?Neurological:  ?   General: No focal deficit present.  ?   Mental Status: He is alert and oriented to person, place, and time.  ?Psychiatric:     ?   Mood and Affect: Mood normal.     ?   Behavior: Behavior normal.  ? ? ?Labs and Microbiology: ?CMP Latest Ref Rng & Units 06/17/2021 05/17/2021 05/04/2021  ?Glucose 70 - 99 mg/dL 101(H) 88 93  ?BUN 6 - 20 mg/dL 15 17 11   ?Creatinine 0.61 - 1.24 mg/dL 1.28(H) 1.28(H) 1.10  ?Sodium 135 - 145 mmol/L 137 141 130(L)  ?Potassium 3.5 - 5.1 mmol/L 4.0 4.2 4.0  ?Chloride 98 - 111 mmol/L 105 107 99  ?CO2 22 - 32 mmol/L 23 26 21(L)  ?Calcium 8.9 - 10.3 mg/dL  9.1 9.5 8.8(L)  ?Total Protein 6.1 - 8.1 g/dL - 7.3 6.9  ?Total Bilirubin 0.2 - 1.2 mg/dL - 0.6 0.6  ?Alkaline Phos 38 - 126 U/L - - 36(L)  ?AST 10 - 40 U/L - 20 37  ?ALT 9 - 46 U/L - 22 26  ? ?CBC Latest Ref Rng & Units 06/17/2021 05/04/2021 07/23/2020  ?WBC 4.0 - 10.5 K/uL 6.0 3.9(L) 10.3  ?Hemoglobin 13.0 - 17.0 g/dL 14.6 15.0 14.6  ?Hematocrit 39.0 - 52.0 % 41.9 44.3 41.8  ?Platelets 150 - 400 K/uL 249 135(L) 276  ?  ? ?Lab Results  ?Component Value Date  ? HIV1RNAQUANT 21,400 (H) 05/17/2021  ? HIV1RNAQUANT 6,880,000 (H) 05/10/2021  ? ? ?RPR and STI: ?Lab Results  ?Component Value Date  ? LABRPR REACTIVE (A) 05/10/2021  ? RPRTITER 1:2 (H) 05/10/2021  ? ? ?STI Results GC CT  ?05/10/2021 Negative Negative  ?05/10/2021 Negative Negative  ?05/10/2021 Negative Negative  ? ? ?Hepatitis B: ?Lab Results  ?Component Value Date  ? HEPBSAB NON-REACTIVE 05/10/2021  ? HEPBSAG NON-REACTIVE 05/10/2021  ? HEPBCAB NON-REACTIVE 05/10/2021  ? ?Hepatitis C: ?Lab Results  ?Component Value Date  ? HEPCAB NON-REACTIVE 05/10/2021  ? ?Hepatitis A: ?Lab  Results  ?Component Value Date  ? HAV NON-REACTIVE 05/10/2021  ? ?Lipids: ?Lab Results  ?Component Value Date  ? CHOL 163 05/17/2021  ? TRIG 138 05/17/2021  ? HDL 29 (L) 05/17/2021  ? CHOLHDL 5.6 (H) 05/17/2021  ? [place] 108 (H) 05/17/2021  ? ? ? ? ? ?ASSESSMENT & PLAN:   ? ?HIV disease ([place]) ?Patient is here today for his routine follow up appointment.  He was last seen 05/17/21 and remains adherent to his [name].  Viral load last month had gone from over 6 million copies down to 21,400 copies with ART initiation.  Will repeat viral load today and RTC 3 months while continuing with [name].  He wants to use [facility] pharmacy so will have Rx sent there going forward.  ? ?Vaccine counseling ?Will given hepatitis B #2 today and Prevnar 20.  ? ?Routine screening for STI (sexually transmitted infection) ?Screening offered and patient declines GC/CT testing at this time.  ? ?Syphilis ?Patient with history of positive RPR.  Discussed with health department previously.  Patient has history of syphilis titer 1:128 in Feb 2021 treated with Bicillin. Most recent titer was 1:2 in January.  Will repeat today but for now no need for further treatment at this time.  ? ?Impetigo ?Rash on left nares appears c/w impetigo.  Will prescribe mupirocin ointment for 5-7 days for empiric treatment.  ? ? ?Orders Placed This Encounter  ?Procedures  ? T-helper cells (CD4) count (not at [facility])  ? RPR  ? HIV-1 RNA quant-no reflex-bld  ? COMPLETE METABOLIC PANEL WITH GFR  ?  ? ? ?Mignon Pine ?[facility] ?[facility] ?06/21/2021, 11:21 AM ? ? ? ? ? ?

## 2021-06-21 NOTE — Progress Notes (Signed)
? ?HPI: Collin Thomas is a 41 y.o. male who presents to the [facility] pharmacy clinic for HIV medication management. ? ?Patient Active Problem List  ? Diagnosis Date Noted  ? Vaccine counseling 05/17/2021  ? Routine screening for STI (sexually transmitted infection) 05/17/2021  ? Syphilis 05/17/2021  ? Encounter for medication monitoring 05/17/2021  ? Ear fullness 05/17/2021  ? HIV disease (H[facility]) 05/10/2021  ? High risk sexual behavior 05/10/2021  ? ? ?Patient's Medications  ?New Prescriptions  ? MUPIROCIN CREAM (BACTROBAN) 2 %    Apply 1 application. topically 2 (two) times daily.  ?Previous Medications  ? BICTEGRAVIR-EMTRICITABINE-TENOFOVIR AF ([name]) 50-200-25 MG TABS TABLET    Take 1 tablet by mouth daily  ? HOMEOPATHIC PRODUCTS (EAR PAIN RELIEF HOMEOPATHIC OT)    Place 5 drops into the left ear daily as needed (ear pain).  ? VALACYCLOVIR (VALTREX) 500 MG TABLET    Take 500 mg by mouth 2 (two) times daily as needed (outbreaks).  ?Modified Medications  ? No medications on file  ?Discontinued Medications  ? No medications on file  ? ? ?Allergies: ?No Known Allergies ? ?Past Medical History: ?Past Medical History:  ?Diagnosis Date  ? Gonorrhea   ? Herpes   ? Syphilis   ? ? ?Social History: ?Social History  ? ?Socioeconomic History  ? Marital status: Single  ?  Spouse name: Not on file  ? Number of children: Not on file  ? Years of education: Not on file  ? Highest education level: Not on file  ?Occupational History  ? Not on file  ?Tobacco Use  ? Smoking status: Every Day  ? Smokeless tobacco: Former  ?Substance and Sexual Activity  ? Alcohol use: Yes  ?  Comment: occ  ? Drug use: Not Currently  ? Sexual activity: Not on file  ?  Comment: declined condoms  ?Other Topics Concern  ? Not on file  ?Social History Narrative  ? Not on file  ? ?Social Determinants of Health  ? ?Financial Resource Strain: Not on file  ?Food Insecurity: Not on file  ?Transportation Needs: Not on file  ?Physical Activity: Not on file   ?Stress: Not on file  ?Social Connections: Not on file  ? ? ?Labs: ?Lab Results  ?Component Value Date  ? HIV1RNAQUANT 21,400 (H) 05/17/2021  ? HIV1RNAQUANT 6,880,000 (H) 05/10/2021  ? ? ?RPR and STI ?Lab Results  ?Component Value Date  ? LABRPR REACTIVE (A) 05/10/2021  ? RPRTITER 1:2 (H) 05/10/2021  ? ? ?STI Results GC CT  ?05/10/2021 Negative Negative  ?05/10/2021 Negative Negative  ?05/10/2021 Negative Negative  ? ? ?Hepatitis B ?Lab Results  ?Component Value Date  ? HEPBSAB NON-REACTIVE 05/10/2021  ? HEPBSAG NON-REACTIVE 05/10/2021  ? HEPBCAB NON-REACTIVE 05/10/2021  ? ?Hepatitis C ?Lab Results  ?Component Value Date  ? HEPCAB NON-REACTIVE 05/10/2021  ? ?Hepatitis A ?Lab Results  ?Component Value Date  ? HAV NON-REACTIVE 05/10/2021  ? ?Lipids: ?Lab Results  ?Component Value Date  ? CHOL 163 05/17/2021  ? TRIG 138 05/17/2021  ? HDL 29 (L) 05/17/2021  ? CHOLHDL 5.6 (H) 05/17/2021  ? LDLCALC 108 (H) 05/17/2021  ? ? ?Current HIV Regimen: ?[name] 50-200-25 mg daily ? ?Assessment: ?He presents today for HIV medication management. States his insurance is [employer] and requests Rx transfer to [employer] from [employer]. Concerned about running out of medication in the interim. Gave a sample of [name] to mitigate concerns of running out and transferred scripts. Otherwise, endorses adherence and  no side effects. ?  ? ?Plan: ?- Continue [name] 50-200-25 mg ?- [facility] ?- Gave 7d sample of [name] ?- Call with any issues or questions ? ?Thank you for allowing pharmacy to be apart of this patient's care ? ?Feliberto Gottron, PharmD Candidate ?

## 2021-06-21 NOTE — Assessment & Plan Note (Signed)
Screening offered and patient declines GC/CT testing at this time.  ?

## 2021-06-21 NOTE — Assessment & Plan Note (Signed)
Patient is here today for his routine follow up appointment.  He was last seen 05/17/21 and remains adherent to his [name].  Viral load last month had gone from over 6 million copies down to 21,400 copies with ART initiation.  Will repeat viral load today and RTC 3 months while continuing with [name].  He wants to use [facility] pharmacy so will have Rx sent there going forward.  ?

## 2021-06-21 NOTE — Assessment & Plan Note (Signed)
Patient with history of positive RPR.  Discussed with health department previously.  Patient has history of syphilis titer 1:128 in Feb 2021 treated with Bicillin. Most recent titer was 1:2 in January.  Will repeat today but for now no need for further treatment at this time.  ?

## 2021-06-21 NOTE — Progress Notes (Signed)
Medication Samples have been provided to the patient.  Drug name: [name]        Strength: 50/200/25 mg       Qty: 7 tablets (1 bottles) LOT: [id number]   Exp.Date: 10/24  Dosing instructions: Take one tablet by mouth once daily  The patient has been instructed regarding the correct time, dose, and frequency of taking this medication, including desired effects and most common side effects.   Baden Betsch, PharmD, CPP Clinical Pharmacist Practitioner Infectious Diseases Clinical Pharmacist [facility]  

## 2021-06-22 ENCOUNTER — Other Ambulatory Visit: Payer: Self-pay

## 2021-06-22 ENCOUNTER — Telehealth: Payer: Self-pay

## 2021-06-22 ENCOUNTER — Other Ambulatory Visit (HOSPITAL_COMMUNITY): Payer: Self-pay

## 2021-06-22 DIAGNOSIS — Z113 Encounter for screening for infections with a predominantly sexual mode of transmission: Secondary | ICD-10-CM

## 2021-06-22 DIAGNOSIS — B20 Human immunodeficiency virus [HIV] disease: Secondary | ICD-10-CM

## 2021-06-22 DIAGNOSIS — A539 Syphilis, unspecified: Secondary | ICD-10-CM

## 2021-06-22 DIAGNOSIS — Z7185 Encounter for immunization safety counseling: Secondary | ICD-10-CM

## 2021-06-22 DIAGNOSIS — Z5181 Encounter for therapeutic drug level monitoring: Secondary | ICD-10-CM

## 2021-06-22 MED ORDER — BICTEGRAVIR-EMTRICITAB-TENOFOV 50-200-25 MG PO TABS: 1.0000 | ORAL_TABLET | Freq: Every day | ORAL | 0 refills | Status: DC

## 2021-06-22 NOTE — Telephone Encounter (Signed)
[facility] Patient Advocate Encounter ? ?[facility] to [facility] the [facility]ay card for [place] . ? ?[id number]: ACCESS ?ID: [id number] ?GROUP : [id number] ? ?Ileene Patrick, CPhT ?Specialty Pharmacy Patient Advocate ?[facility] for [facility] ?Phone: [phone] ?Fax:  [phone]  ?

## 2021-06-23 LAB — COMPLETE METABOLIC PANEL WITH GFR
AG Ratio: 1.6 (calc) (ref 1.0–2.5)
ALT: 25 U/L (ref 9–46)
AST: 16 U/L (ref 10–40)
Albumin: 4.5 g/dL (ref 3.6–5.1)
Alkaline phosphatase (APISO): 47 U/L (ref 36–130)
BUN: 22 mg/dL (ref 7–25)
CO2: 23 mmol/L (ref 20–32)
Calcium: 9.5 mg/dL (ref 8.6–10.3)
Chloride: 105 mmol/L (ref 98–110)
Creat: 1.14 mg/dL (ref 0.60–1.26)
Globulin: 2.8 g/dL (calc) (ref 1.9–3.7)
Glucose, Bld: 90 mg/dL (ref 65–99)
Potassium: 4.2 mmol/L (ref 3.5–5.3)
Sodium: 138 mmol/L (ref 135–146)
Total Bilirubin: 0.5 mg/dL (ref 0.2–1.2)
Total Protein: 7.3 g/dL (ref 6.1–8.1)
eGFR: 84 mL/min/{1.73_m2} (ref >=60)

## 2021-06-23 LAB — T-HELPER CELLS (CD4) COUNT (NOT AT ARMC)
Absolute CD4: 864 cells/uL (ref 490–1740)
CD4 T Helper %: 43 % (ref 30–61)
Total lymphocyte count: 2012 cells/uL (ref 850–3900)

## 2021-06-23 LAB — HIV-1 RNA QUANT-NO REFLEX-BLD
HIV 1 RNA Quant: <20 Copies/mL — ABNORMAL HIGH
HIV-1 RNA Quant, Log: <1.3 Log cps/mL — ABNORMAL HIGH

## 2021-06-23 LAB — RPR: RPR Ser Ql: NONREACTIVE

## 2021-07-31 ENCOUNTER — Ambulatory Visit: Payer: BC Managed Care – PPO | Admitting: Internal Medicine

## 2021-09-12 ENCOUNTER — Other Ambulatory Visit: Payer: Self-pay

## 2021-09-12 ENCOUNTER — Other Ambulatory Visit: Payer: BC Managed Care – PPO

## 2021-09-12 DIAGNOSIS — B20 Human immunodeficiency virus [HIV] disease: Secondary | ICD-10-CM

## 2021-09-12 DIAGNOSIS — A539 Syphilis, unspecified: Secondary | ICD-10-CM

## 2021-09-12 DIAGNOSIS — Z113 Encounter for screening for infections with a predominantly sexual mode of transmission: Secondary | ICD-10-CM

## 2021-09-12 DIAGNOSIS — Z5181 Encounter for therapeutic drug level monitoring: Secondary | ICD-10-CM

## 2021-09-12 DIAGNOSIS — Z7185 Encounter for immunization safety counseling: Secondary | ICD-10-CM

## 2021-09-12 MED ORDER — BICTEGRAVIR-EMTRICITAB-TENOFOV 50-200-25 MG PO TABS: 1.0000 | ORAL_TABLET | Freq: Every day | ORAL | 0 refills | Status: DC

## 2021-09-13 ENCOUNTER — Telehealth: Payer: Self-pay

## 2021-09-13 DIAGNOSIS — A539 Syphilis, unspecified: Secondary | ICD-10-CM

## 2021-09-13 DIAGNOSIS — B20 Human immunodeficiency virus [HIV] disease: Secondary | ICD-10-CM

## 2021-09-13 DIAGNOSIS — Z113 Encounter for screening for infections with a predominantly sexual mode of transmission: Secondary | ICD-10-CM

## 2021-09-13 DIAGNOSIS — Z5181 Encounter for therapeutic drug level monitoring: Secondary | ICD-10-CM

## 2021-09-13 DIAGNOSIS — Z7185 Encounter for immunization safety counseling: Secondary | ICD-10-CM

## 2021-09-13 LAB — T-HELPER CELL (CD4) - (RCID CLINIC ONLY)
CD4 % Helper T Cell: 39 % (ref 33–65)
CD4 T Cell Abs: 639 /uL (ref 400–1790)

## 2021-09-13 MED ORDER — BICTEGRAVIR-EMTRICITAB-TENOFOV 50-200-25 MG PO TABS: 1.0000 | ORAL_TABLET | Freq: Every day | ORAL | 1 refills | Status: AC

## 2021-09-13 NOTE — Telephone Encounter (Signed)
-----   Message from Bobette Mo, CPhT sent at 09/11/2021  9:33 AM EDT ----- Regarding: Collin Thomas ,  This patient called saying he have like 1 week left of medication , which he gets a 3 month supply with his mail order pharmacy and he will be out by the time his next appt. Will you be able to send him in a 3 month supply before his next appt?    Thank You,  Clearance Coots, CPhT Specialty Pharmacy Patient [facility] for Infectious Disease Phone: [phone] Fax: [phone]

## 2021-09-13 NOTE — Telephone Encounter (Signed)
Medication request sent to pharmacy as requested. Patient made aware that medication sent to pharmacy.  Collin Thomas

## 2021-09-16 LAB — HIV-1 RNA QUANT-NO REFLEX-BLD
HIV 1 RNA Quant: NOT DETECTED Copies/mL
HIV-1 RNA Quant, Log: NOT DETECTED Log cps/mL

## 2021-09-19 DIAGNOSIS — S39012A Strain of muscle, fascia and tendon of lower back, initial encounter: Secondary | ICD-10-CM | POA: Diagnosis not present

## 2021-09-19 DIAGNOSIS — M9901 Segmental and somatic dysfunction of cervical region: Secondary | ICD-10-CM | POA: Diagnosis not present

## 2021-09-19 DIAGNOSIS — M5383 Other specified dorsopathies, cervicothoracic region: Secondary | ICD-10-CM | POA: Diagnosis not present

## 2021-09-19 DIAGNOSIS — M9902 Segmental and somatic dysfunction of thoracic region: Secondary | ICD-10-CM | POA: Diagnosis not present

## 2021-09-20 DIAGNOSIS — M5383 Other specified dorsopathies, cervicothoracic region: Secondary | ICD-10-CM | POA: Diagnosis not present

## 2021-09-20 DIAGNOSIS — S39012A Strain of muscle, fascia and tendon of lower back, initial encounter: Secondary | ICD-10-CM | POA: Diagnosis not present

## 2021-09-20 DIAGNOSIS — M9902 Segmental and somatic dysfunction of thoracic region: Secondary | ICD-10-CM | POA: Diagnosis not present

## 2021-09-20 DIAGNOSIS — M9901 Segmental and somatic dysfunction of cervical region: Secondary | ICD-10-CM | POA: Diagnosis not present

## 2021-09-25 ENCOUNTER — Other Ambulatory Visit: Payer: BC Managed Care – PPO

## 2021-09-25 ENCOUNTER — Encounter: Payer: BC Managed Care – PPO | Admitting: Internal Medicine

## 2021-10-09 ENCOUNTER — Encounter: Payer: BC Managed Care – PPO | Admitting: Internal Medicine

## 2021-10-18 DIAGNOSIS — M9902 Segmental and somatic dysfunction of thoracic region: Secondary | ICD-10-CM | POA: Diagnosis not present

## 2021-10-18 DIAGNOSIS — M5383 Other specified dorsopathies, cervicothoracic region: Secondary | ICD-10-CM | POA: Diagnosis not present

## 2021-10-18 DIAGNOSIS — S39012A Strain of muscle, fascia and tendon of lower back, initial encounter: Secondary | ICD-10-CM | POA: Diagnosis not present

## 2021-10-18 DIAGNOSIS — M9901 Segmental and somatic dysfunction of cervical region: Secondary | ICD-10-CM | POA: Diagnosis not present

## 2021-10-19 ENCOUNTER — Encounter: Payer: BC Managed Care – PPO | Admitting: Internal Medicine

## 2021-10-24 ENCOUNTER — Encounter: Payer: BC Managed Care – PPO | Admitting: Internal Medicine

## 2021-10-30 ENCOUNTER — Encounter: Payer: BC Managed Care – PPO | Admitting: Internal Medicine

## 2021-11-06 ENCOUNTER — Other Ambulatory Visit: Payer: Self-pay

## 2021-11-06 ENCOUNTER — Encounter: Payer: BC Managed Care – PPO | Admitting: Internal Medicine

## 2021-11-20 ENCOUNTER — Telehealth: Payer: Self-pay | Admitting: Internal Medicine

## 2021-11-20 ENCOUNTER — Encounter: Payer: BC Managed Care – PPO | Admitting: Internal Medicine

## 2021-11-20 NOTE — Progress Notes (Deleted)
[facility] Infectious Disease   CHIEF COMPLAINT    HIV follow up.  ***  SUBJECTIVE:    Collin Thomas is a 41 y.o. male with PMHx as below who presents to the clinic for HIV follow up.   Please see A&P for the details of today's visit and status of the patient's medical problems.   Patient's Medications  New Prescriptions   No medications on file  Previous Medications   BICTEGRAVIR-EMTRICITABINE-TENOFOVIR AF ([name]) 50-200-25 MG TABS TABLET    Take 1 tablet by mouth daily   HOMEOPATHIC PRODUCTS (EAR PAIN RELIEF HOMEOPATHIC OT)    Place 5 drops into the left ear daily as needed (ear pain).   MUPIROCIN CREAM (BACTROBAN) 2 %    Apply 1 application. topically 2 (two) times daily.   VALACYCLOVIR (VALTREX) 500 MG TABLET    Take 500 mg by mouth 2 (two) times daily as needed (outbreaks).  Modified Medications   No medications on file  Discontinued Medications   No medications on file      Past Medical History:  Diagnosis Date   Gonorrhea    Herpes    Syphilis     Social History   Tobacco Use   Smoking status: Every Day   Smokeless tobacco: Former  Substance Use Topics   Alcohol use: Yes    Comment: occ   Drug use: Not Currently    No family history on file.  No Known Allergies  ROS   OBJECTIVE:    There were no vitals filed for this visit.   There is no height or weight on file to calculate BMI.  Physical Exam  Labs and Microbiology:    Latest Ref Rng & Units 06/21/2021   11:17 AM 06/17/2021    7:44 AM 05/17/2021    3:22 PM  CMP  Glucose 65 - 99 mg/dL 90  101  88   BUN 7 - 25 mg/dL 22  15  17    Creatinine 0.60 - 1.26 mg/dL 1.14  1.28  1.28   Sodium 135 - 146 mmol/L 138  137  141   Potassium 3.5 - 5.3 mmol/L 4.2  4.0  4.2   Chloride 98 - 110 mmol/L 105  105  107   CO2 20 - 32 mmol/L 23  23  26    Calcium 8.6 - 10.3 mg/dL 9.5  9.1  9.5   Total Protein 6.1 - 8.1 g/dL 7.3   7.3   Total Bilirubin 0.2 - 1.2 mg/dL 0.5   0.6   AST 10 -  40 U/L 16   20   ALT 9 - 46 U/L 25   22       Latest Ref Rng & Units 06/17/2021    7:44 AM 05/04/2021    9:42 AM 07/23/2020   12:23 PM  CBC  WBC 4.0 - 10.5 K/uL 6.0  3.9  10.3   Hemoglobin 13.0 - 17.0 g/dL 14.6  15.0  14.6   Hematocrit 39.0 - 52.0 % 41.9  44.3  41.8   Platelets 150 - 400 K/uL 249  135  276      Lab Results  Component Value Date   HIV1RNAQUANT Not Detected 09/12/2021   HIV1RNAQUANT <20 (H) 06/21/2021   HIV1RNAQUANT 21,400 (H) 05/17/2021   CD4TABS 639 09/12/2021    RPR and STI: Lab Results  Component Value Date   LABRPR NON-REACTIVE 06/21/2021   LABRPR REACTIVE (A) 05/10/2021   RPRTITER  1:2 (H) 05/10/2021    STI Results GC CT  05/10/2021  4:08 PM Negative    Negative    Negative  Negative    Negative    Negative     Hepatitis B: Lab Results  Component Value Date   HEPBSAB NON-REACTIVE 05/10/2021   HEPBSAG NON-REACTIVE 05/10/2021   HEPBCAB NON-REACTIVE 05/10/2021   Hepatitis C: Lab Results  Component Value Date   HEPCAB NON-REACTIVE 05/10/2021   Hepatitis A: Lab Results  Component Value Date   HAV NON-REACTIVE 05/10/2021   Lipids: Lab Results  Component Value Date   CHOL 163 05/17/2021   TRIG 138 05/17/2021   HDL 29 (L) 05/17/2021   CHOLHDL 5.6 (H) 05/17/2021   LDLCALC 108 (H) 05/17/2021    Imaging: ***   ASSESSMENT & PLAN:    No problem-specific Assessment & Plan notes found for this encounter.   No orders of the defined types were placed in this encounter.      *** Vaccines Influenza: give every year COVID: recommend vaccination if not already done Prevnar 20: Give x 1 if no prior pneumonia vaccine.    - If only 1 dose of either PPSV 23 OR PCV 13 ----> give PCV 20 if > 1 year since last vaccine  - If received both PPSV 23 AND PCV 13 ----> give PCV 20 if > 5 years since last vaccine.  If < 5 years then wait to give PCV 20 Pneumovax-23: (if CD4 >200) give twice every 5 years apart before age 73, then once at age 20.   Give >8 weeks from [place] Prevnar-13: (preferably when CD4 >200) give once, give >1 year from last Pneumovax-23 Hepatitis A: give Havrix 2 dose series at 0 and 6-12 months if non-immune Hepatitis B: give Heplisav 2 dose series at 0 and 4 weeks if non-immune.  Repeat serology 2 months after vaccine and revaccinate if needed MenACWY: 2 dose primary series 8 weeks apart, then 1 dose booster every 5 years HPV: Gardasil-9 at 0, 2, and 6 months for ages 9-26 should be vaccinated.  Ages 33-45 should be offered if appropriate Tdap: give every 10 years Shingles: give Shingrix 2 dose series at 0 and 2-6 months if >50 years on ART with CD4 cell count >200 Varicella: primary vaccination may be considered in VZV seronegative persons aged >8 years (if CD4 >200)  MMR: vaccine should be given if born in [birth year] or after and do not have immunity (if CD4 >200)  Screening DEXA Scan: if age >59 Quantiferon: check at initiation of care Hepatitis C: check at initiation of care.  Screen annually if risk factors HLA B5701: check at initiation of care G6PD: check if starting therapy with oxidant drugs Lipids: check annually Urinalysis: check annually or every 6 months if on tenofovir Hgb A1c: check annually  ASCVD Risk Score Consider high-intensity statin therapy if 10-year ASCVD risk score >7.5% The 10-year ASCVD risk score (Arnett DK, et al., 2019) is: 4.4%   [name] for Infectious Disease [facility] 11/20/2021, 5:46 AM  HIV: Patient is here today for routine HIV follow up and was last seen on 06/21/21.  He is currently on [name] and reports *** with adherence, access, or tolerating his medications. He gets his medication via [facility].  His labs in March showed his viral load was undetectable and again in May.  Will repeat labs today, continue daily [name], and follow up in 6 months.    Vaccines:  Due for hepatitis A #2 today and ***.     STI/Screening: Screening offered today and he ***.   Encounter for medication monitoring: ***

## 2021-11-20 NOTE — Telephone Encounter (Signed)
Pt called to cancel 3 mo f/u with Dr. Earlene Plater. Due to the volume of cancellations within the same day of appointments, checked w/ CMA and Dr. Earlene Plater on how they would like to proceed. At this time pt is scheduled to f/u with Pharmacists on 11/30/2021 at 2:30pm

## 2021-11-25 IMAGING — CT CT ANGIO CHEST
2 of 7 series · 18 of 46 positions shown · IV contrast (APPLIED)
Comparison: None.

CLINICAL DATA: Positive D-dimer. Tachycardia. PE suspected,
low/intermediate prob, positive D-dimer

EXAM:
CT ANGIOGRAPHY CHEST WITH CONTRAST
TECHNIQUE: Multidetector CT imaging of the chest was performed using the
standard protocol during bolus administration of intravenous
contrast. Multiplanar CT image reconstructions and MIPs were
obtained to evaluate the vascular anatomy.
CONTRAST:  75mL OMNIPAQUE IOHEXOL 350 MG/ML SOLN

[Series 8: thins · axial · 0.63mm/px · z∈[+1306,+1586]mm · 15 of 451 slices shown]
[im 26/451  lung]
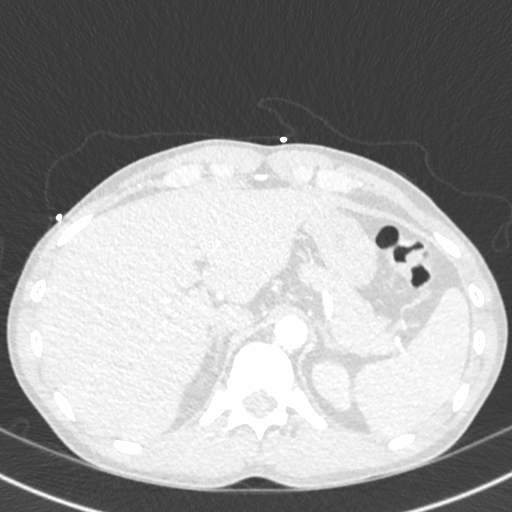
[im 51/451  soft-tissue]
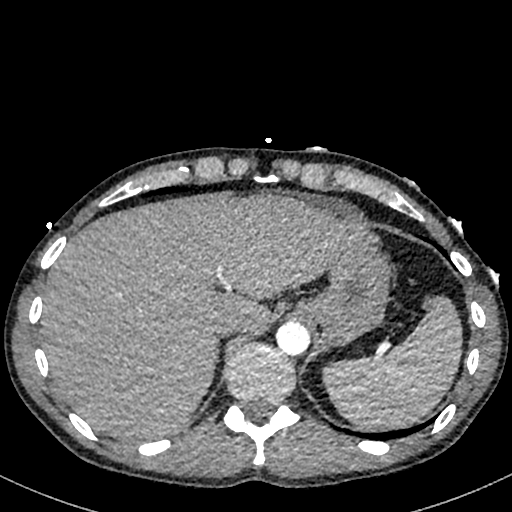
[im 76/451  lung]
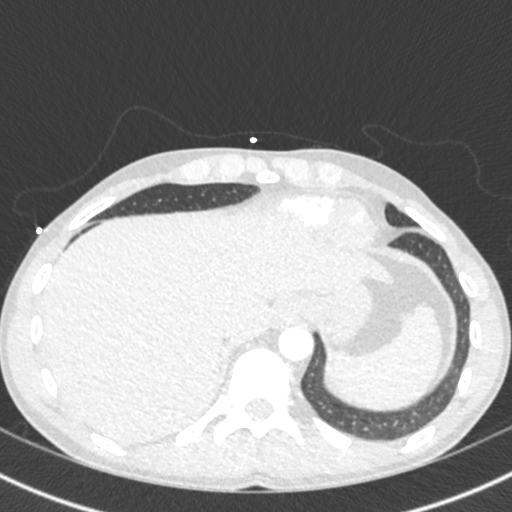
[im 101/451  soft-tissue]
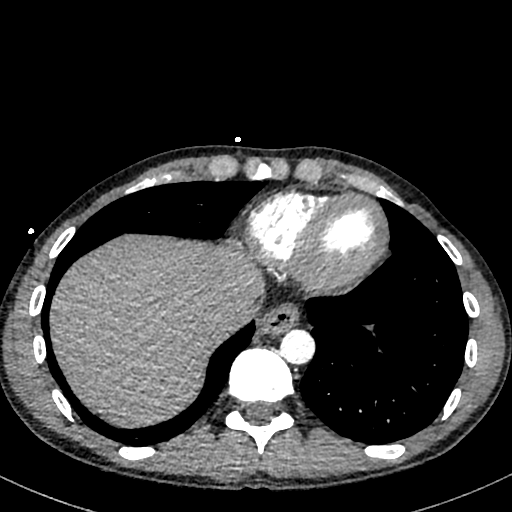
[im 151/451  lung]
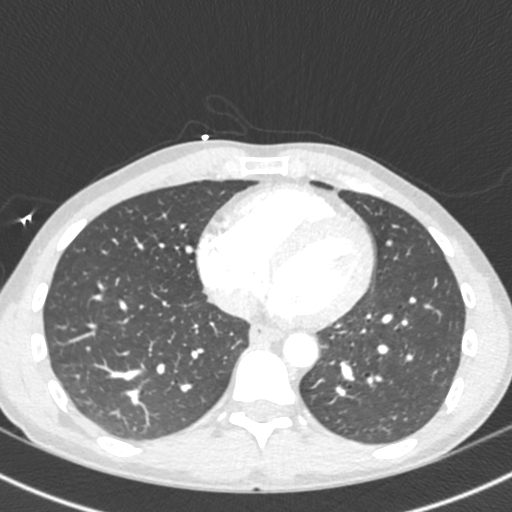
[im 176/451  soft-tissue]
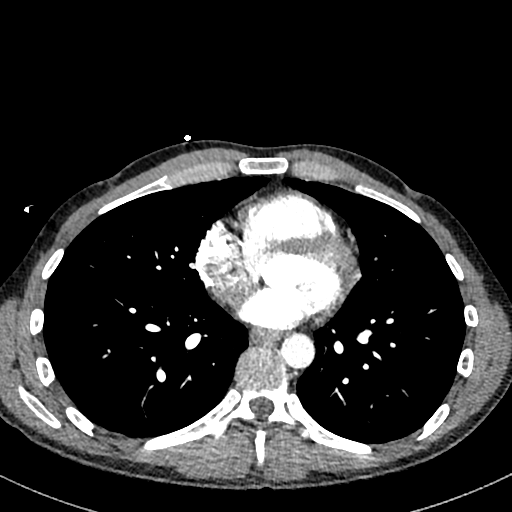
[im 201/451  lung]
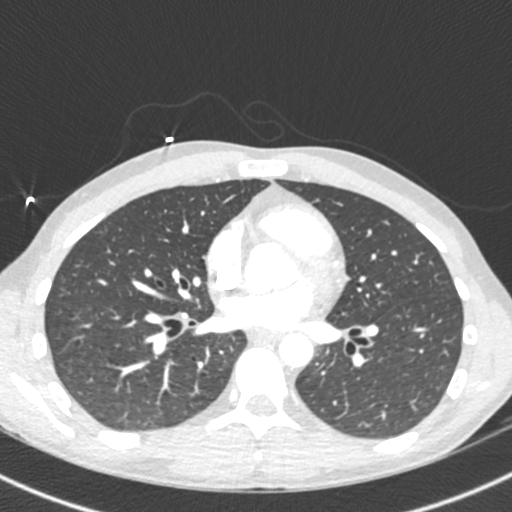
[im 226/451  soft-tissue]
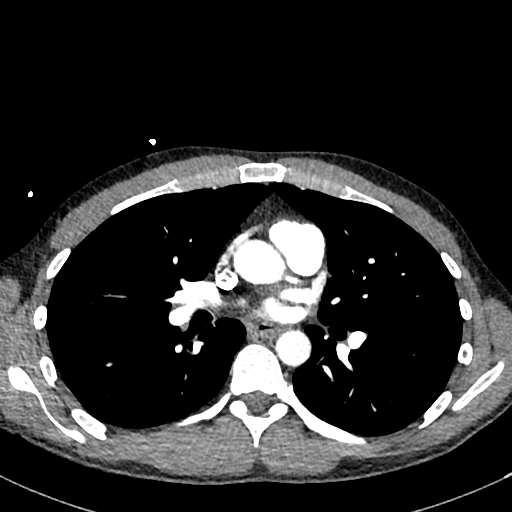
[im 251/451  lung]
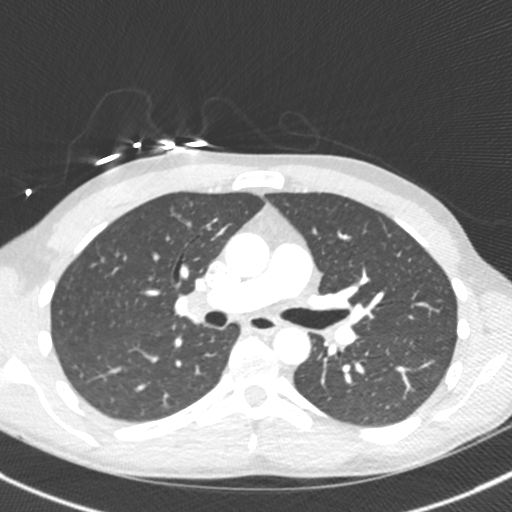
[im 276/451  soft-tissue]
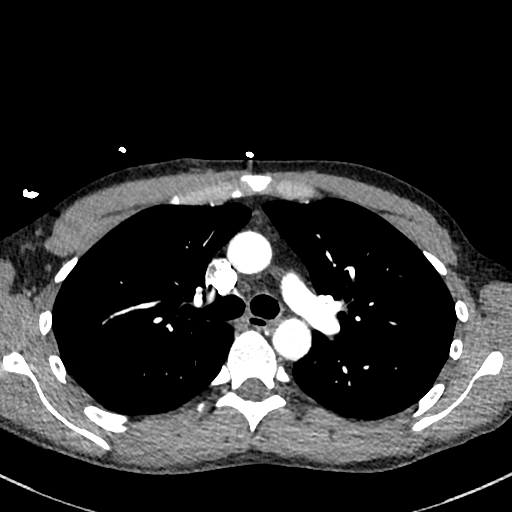
[im 301/451  lung]
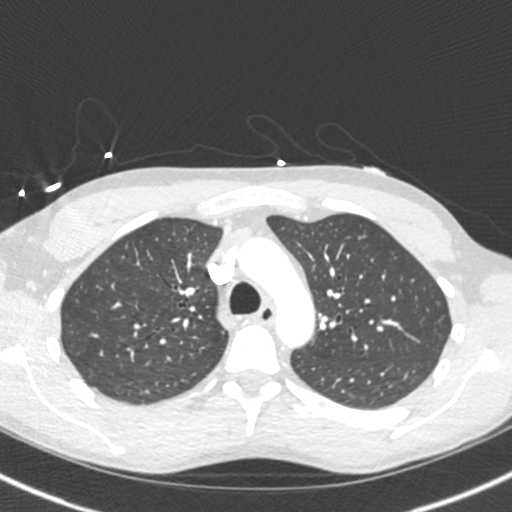
[im 351/451  soft-tissue]
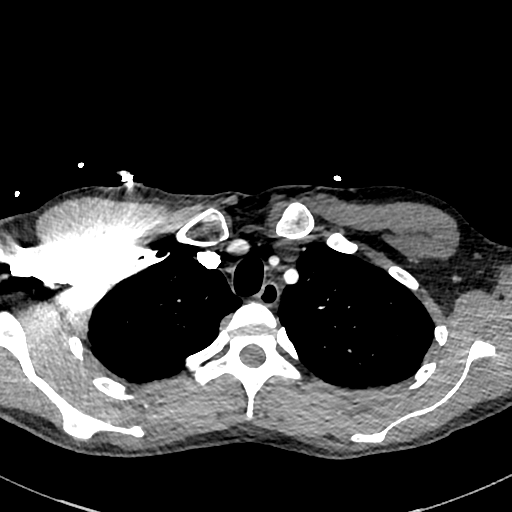
[im 376/451  lung]
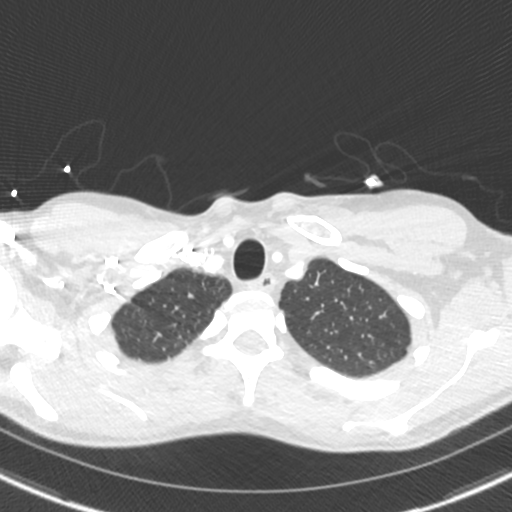
[im 401/451  soft-tissue]
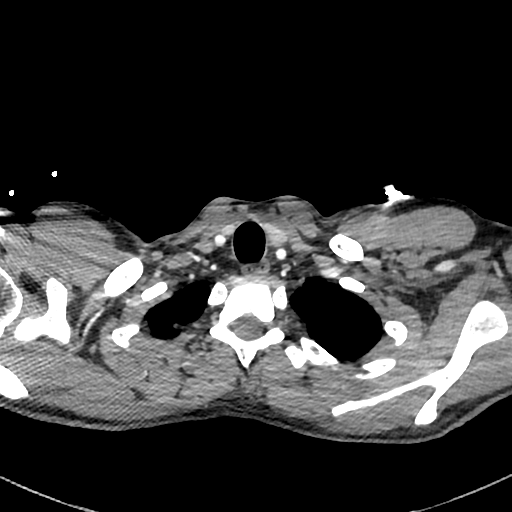
[im 426/451  lung]
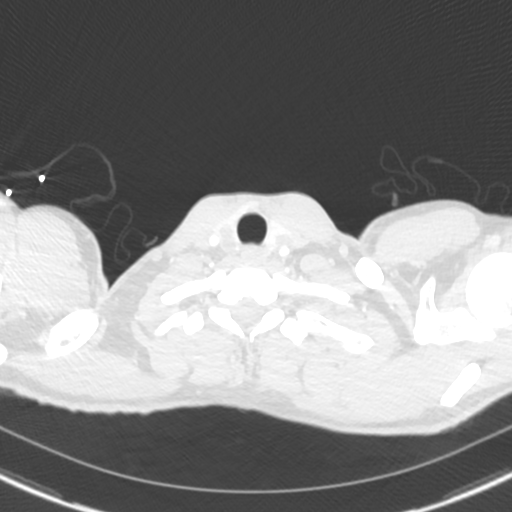

[Series 9: cor · coronal · 0.65mm/px · 3 of 113 slices shown]
[im 29/113  soft-tissue]
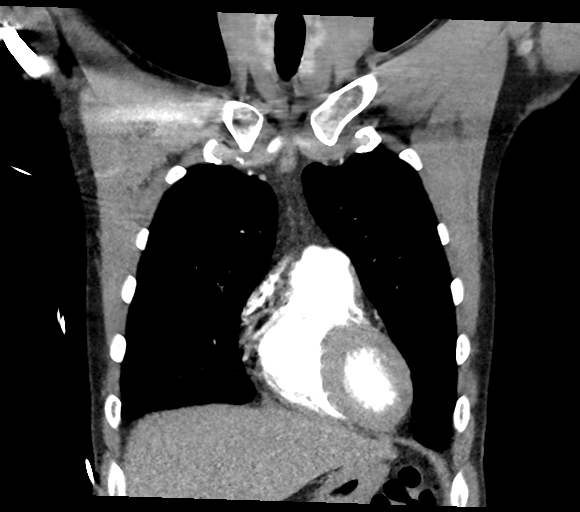
[im 57/113  soft-tissue]
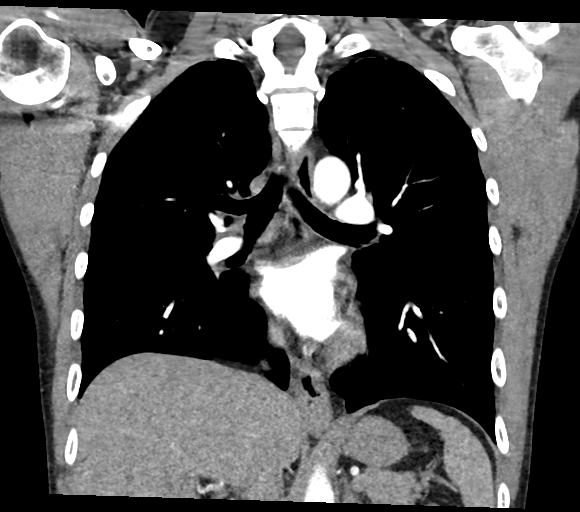
[im 85/113  soft-tissue]
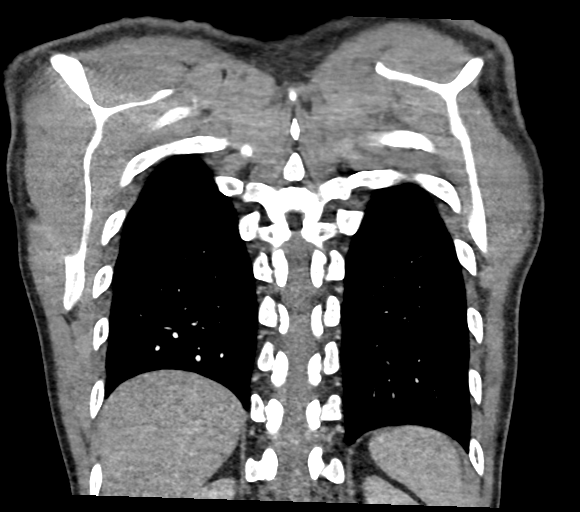

[18 of 46 positions shown; findings below may reference images not displayed]

FINDINGS: Cardiovascular: Contrast injection is sufficient to demonstrate
satisfactory opacification of the pulmonary arteries to the
segmental level. There is no pulmonary embolus or evidence of right
heart strain. The size of the main pulmonary artery is normal. Heart
size is normal, with no pericardial effusion. The course and caliber
of the aorta are normal. There is no atherosclerotic calcification.
Opacification decreased due to pulmonary arterial phase contrast
bolus timing.

Mediastinum/Nodes: No mediastinal, hilar or axillary
lymphadenopathy. Normal visualized thyroid. Thoracic esophageal
course is normal.

Lungs/Pleura: Airways are patent. No pleural effusion, lobar
consolidation, pneumothorax or pulmonary infarction.

Upper Abdomen: Contrast bolus timing is not optimized for evaluation
of the abdominal organs. The visualized portions of the organs of
the upper abdomen are normal.

Musculoskeletal: No chest wall abnormality. No bony spinal canal
stenosis.

Review of the MIP images confirms the above findings.
IMPRESSION: No pulmonary embolus or other acute thoracic abnormality.

## 2021-11-30 ENCOUNTER — Ambulatory Visit: Payer: BC Managed Care – PPO | Admitting: Pharmacist

## 2021-12-03 ENCOUNTER — Ambulatory Visit: Payer: BC Managed Care – PPO | Admitting: Pharmacist

## 2021-12-14 ENCOUNTER — Ambulatory Visit: Payer: BC Managed Care – PPO | Admitting: Pharmacist

## 2021-12-28 ENCOUNTER — Ambulatory Visit: Payer: BC Managed Care – PPO | Admitting: Pharmacist

## 2022-01-03 DIAGNOSIS — H1045 Other chronic allergic conjunctivitis: Secondary | ICD-10-CM | POA: Diagnosis not present

## 2022-01-08 ENCOUNTER — Telehealth: Payer: Self-pay

## 2022-01-08 NOTE — Telephone Encounter (Signed)
Called patient back and reviewed that moxifloxacin should be well tolerated for only 7 days of treatment. Will follow-up in person with patient this Friday. Thanks Junie Panning! Estill Bamberg

## 2022-01-08 NOTE — Telephone Encounter (Signed)
Patient states he is having pain with urination since last Monday. Was recently identified as a chlamydia contact, tested at the health dept and completed doxycycline (7 days). However, stated his chlamydia test came back negative, and HD prescribed moxifloxacin 400 mg daily for 7 days for "mycoplasma genitalium".  Patient is concerned about the black box warnings on the moxifloxacin label and has not started taking the antibiotic. Requests advice on the severity of potential side effects.   Please advise; pt requests call to discuss [phone]). Confirmed he has an appointment here at [facility] on 9/29.  Binnie Kand, RN

## 2022-01-11 ENCOUNTER — Other Ambulatory Visit: Payer: Self-pay

## 2022-01-11 ENCOUNTER — Ambulatory Visit (INDEPENDENT_AMBULATORY_CARE_PROVIDER_SITE_OTHER): Payer: BC Managed Care – PPO | Admitting: Pharmacist

## 2022-01-11 DIAGNOSIS — Z23 Encounter for immunization: Secondary | ICD-10-CM

## 2022-01-11 DIAGNOSIS — B20 Human immunodeficiency virus [HIV] disease: Secondary | ICD-10-CM

## 2022-01-11 NOTE — Progress Notes (Signed)
HPI: Collin Thomas is a 41 y.o. male who presents to the [facility] pharmacy clinic for HIV follow-up.  Patient Active Problem List   Diagnosis Date Noted   Impetigo 06/21/2021   Vaccine counseling 05/17/2021   Routine screening for STI (sexually transmitted infection) 05/17/2021   Syphilis 05/17/2021   Encounter for medication monitoring 05/17/2021   Ear fullness 05/17/2021   HIV disease (HCC) 05/10/2021   High risk sexual behavior 05/10/2021    Patient's Medications  New Prescriptions   No medications on file  Previous Medications   BICTEGRAVIR-EMTRICITABINE-TENOFOVIR AF ([name]) 50-200-25 MG TABS TABLET    Take 1 tablet by mouth daily   HOMEOPATHIC PRODUCTS (EAR PAIN RELIEF HOMEOPATHIC OT)    Place 5 drops into the left ear daily as needed (ear pain).   MUPIROCIN CREAM (BACTROBAN) 2 %    Apply 1 application. topically 2 (two) times daily.   VALACYCLOVIR (VALTREX) 500 MG TABLET    Take 500 mg by mouth 2 (two) times daily as needed (outbreaks).  Modified Medications   No medications on file  Discontinued Medications   No medications on file    Allergies: No Known Allergies  Past Medical History: Past Medical History:  Diagnosis Date   Gonorrhea    Herpes    Syphilis     Social History: Social History   Socioeconomic History   Marital status: Single    Spouse name: Not on file   Number of children: Not on file   Years of education: Not on file   Highest education level: Not on file  Occupational History   Not on file  Tobacco Use   Smoking status: Every Day   Smokeless tobacco: Former  Substance and Sexual Activity   Alcohol use: Yes    Comment: occ   Drug use: Not Currently   Sexual activity: Not on file    Comment: declined condoms  Other Topics Concern   Not on file  Social History Narrative   Not on file   Social Determinants of Health   Financial Resource Strain: Not on file  Food Insecurity: Not on file  Transportation Needs: Not on file   Physical Activity: Not on file  Stress: Not on file  Social Connections: Not on file    Labs: Lab Results  Component Value Date   HIV1RNAQUANT Not Detected 09/12/2021   HIV1RNAQUANT <20 (H) 06/21/2021   HIV1RNAQUANT 21,400 (H) 05/17/2021   CD4TABS 639 09/12/2021    RPR and STI Lab Results  Component Value Date   LABRPR NON-REACTIVE 06/21/2021   LABRPR REACTIVE (A) 05/10/2021   RPRTITER 1:2 (H) 05/10/2021    STI Results GC CT  05/10/2021  4:08 PM Negative    Negative    Negative  Negative    Negative    Negative     Hepatitis B Lab Results  Component Value Date   HEPBSAB NON-REACTIVE 05/10/2021   HEPBSAG NON-REACTIVE 05/10/2021   HEPBCAB NON-REACTIVE 05/10/2021   Hepatitis C Lab Results  Component Value Date   HEPCAB NON-REACTIVE 05/10/2021   Hepatitis A Lab Results  Component Value Date   HAV NON-REACTIVE 05/10/2021   Lipids: Lab Results  Component Value Date   CHOL 163 05/17/2021   TRIG 138 05/17/2021   HDL 29 (L) 05/17/2021   CHOLHDL 5.6 (H) 05/17/2021   LDLCALC 108 (H) 05/17/2021    Current HIV Regimen: [name]  Assessment: Collin Thomas presents to clinic today for HIV follow-up. He was last seen by Dr.  Juleen China 06/21/21 and has cancelled/missed multiple appointments since then. He apologizes for this and admits to being very busy and not realizing the importance of coming back to clinic if he was taking his medicine everyday. Reviewed that we still need to monitor his viral load and other labs even if he is taking his medicine everyday. He verbalized understanding. He has been taking [name] everyday without missed doses and has adjusted well to it now. Will check HIV RNA and CD4 count today. Will have him follow up with Dr. Juleen China in 3-4 months.   He is currently completing 7 days of moxifloxacin through the health department for Mycoplasma genitalium concern. He tested negative for urine/oral/rectal gonorrhea/chlamydia/trichomonas and syphilis on 9/18  and 9/25 after potential exposure from a sexual partner. He had consistent burning upon urination which did not improve after completing 2 weeks of doxycycline. Thus, the health department decided to trial moxifloxacin. Patient is on day 4 of treatment and states the burning has improved but is still present. No STI testing completed today given recent testing. He is still concerned about moxifloxacin's tolerability and feels like his ankle is hurting him. Reviewed he can stretch, exercise, and massage the area. There is no swelling, redness, or bruising. He understands it is unlikely to be related to his short course of moxifloxacin. He expressed understanding.  Patient deferred flu shot today. Will check hepatitis B surface antibody given he recently completely the hepatitis B vaccination series. Will complete hepatitis A vaccination series today.  Plan: - Continue [name] - Check HIV RNA, CD4 count, and HBV sAb - Administered Havrix #2 - Follow-up with Dr. Juleen China on 04/17/22   Alfonse Spruce, PharmD, CPP, [place] Clinical Pharmacist Practitioner Infectious Diseases [place] [facility] 01/11/2022, 9:29 AM

## 2022-01-15 LAB — T-HELPER CELLS (CD4) COUNT (NOT AT ARMC)
Absolute CD4: 893 cells/uL (ref 490–1740)
CD4 T Helper %: 41 % (ref 30–61)
Total lymphocyte count: 2163 cells/uL (ref 850–3900)

## 2022-01-15 LAB — HEPATITIS B SURFACE ANTIBODY,QUALITATIVE: Hep B S Ab: REACTIVE — AB

## 2022-01-15 LAB — HIV-1 RNA QUANT-NO REFLEX-BLD
HIV 1 RNA Quant: NOT DETECTED Copies/mL
HIV-1 RNA Quant, Log: NOT DETECTED Log cps/mL

## 2022-02-01 ENCOUNTER — Other Ambulatory Visit: Payer: Self-pay | Admitting: Pharmacist

## 2022-02-01 DIAGNOSIS — B009 Herpesviral infection, unspecified: Secondary | ICD-10-CM

## 2022-02-01 MED ORDER — VALACYCLOVIR HCL 1 G PO TABS: ORAL_TABLET | ORAL | 2 refills | Status: DC

## 2022-02-01 NOTE — Progress Notes (Signed)
Patient requesting new script for valacyclovir given repeated HSV outbreaks recently. States he has been taking two 500mg  tablets together which seems to help prevent the next outbreaks compared to his 500mg  dosing. Usually his PCP sends in but [facility] [address] [facility]. Will send as 1000mg  once daily x 5 days as needed for outbreaks.  Alfonse Spruce, PharmD, CPP, BCIDP, [place] Clinical Pharmacist Practitioner Infectious [place] for Infectious Disease

## 2022-02-06 ENCOUNTER — Telehealth: Payer: Self-pay

## 2022-02-06 NOTE — Telephone Encounter (Signed)
Received call from patient. States he took a gram of valcyclovir and a biktarvy pill at the same time. States he last ate a couple hours ago. Patient is concerned about the possibility of interactions between the two medications. Requests call back.  Please advise. Routed to [facility] team.  Binnie Kand, RN

## 2022-02-06 NOTE — Telephone Encounter (Signed)
Taking valacyclovir and [name] together is fine particularly since he will only take valacyclovir for a few days. The interaction is limited anyway and is only concerning (for closer monitoring) if the patient is on this combination for a more extended period of time. Estill Bamberg

## 2022-02-06 NOTE — Telephone Encounter (Signed)
Spoke with patient and relayed message verbatim from pharmacy, "Taking valacyclovir and [name] together is fine particularly since he will only take valacyclovir for a few days. The interaction is limited anyway and is only concerning (for closer monitoring) if the patient is on this combination for a more extended period of time."  Patient verbalized understanding and had no additional questions.  Binnie Kand, RN

## 2022-03-26 ENCOUNTER — Other Ambulatory Visit (HOSPITAL_COMMUNITY): Payer: Self-pay

## 2022-03-26 NOTE — Telephone Encounter (Signed)
Hello [place],  I see that his Susanne Borders was filled on 02/22/22 for a 90 day supply so he should have enough medication until 04/30/22.

## 2022-04-17 ENCOUNTER — Ambulatory Visit: Payer: BC Managed Care – PPO | Admitting: Internal Medicine

## 2022-04-22 ENCOUNTER — Other Ambulatory Visit: Payer: Self-pay

## 2022-04-22 ENCOUNTER — Encounter: Payer: Self-pay | Admitting: Internal Medicine

## 2022-04-22 ENCOUNTER — Ambulatory Visit (INDEPENDENT_AMBULATORY_CARE_PROVIDER_SITE_OTHER): Payer: BC Managed Care – PPO

## 2022-04-22 ENCOUNTER — Ambulatory Visit: Payer: BC Managed Care – PPO | Admitting: Internal Medicine

## 2022-04-22 ENCOUNTER — Other Ambulatory Visit (HOSPITAL_COMMUNITY)
Admission: RE | Admit: 2022-04-22 | Discharge: 2022-04-22 | Disposition: A | Discharge status: Home / Self Care | Payer: BC Managed Care – PPO | Source: Ambulatory Visit | Attending: Internal Medicine | Admitting: Internal Medicine

## 2022-04-22 VITALS — BP 114/75 | HR 68 | Temp 97.2°F | Ht 75.0 in | Wt 166.0 lb

## 2022-04-22 DIAGNOSIS — Z23 Encounter for immunization: Secondary | ICD-10-CM

## 2022-04-22 DIAGNOSIS — B20 Human immunodeficiency virus [HIV] disease: Secondary | ICD-10-CM | POA: Diagnosis not present

## 2022-04-22 DIAGNOSIS — Z21 Asymptomatic human immunodeficiency virus [HIV] infection status: Secondary | ICD-10-CM | POA: Diagnosis not present

## 2022-04-22 DIAGNOSIS — A539 Syphilis, unspecified: Secondary | ICD-10-CM | POA: Diagnosis not present

## 2022-04-22 DIAGNOSIS — Z7185 Encounter for immunization safety counseling: Secondary | ICD-10-CM | POA: Diagnosis not present

## 2022-04-22 DIAGNOSIS — Z113 Encounter for screening for infections with a predominantly sexual mode of transmission: Secondary | ICD-10-CM

## 2022-04-22 MED ORDER — BIKTARVY 50-200-25 MG PO TABS: 1.0000 | ORAL_TABLET | Freq: Every day | ORAL | 3 refills | Status: DC

## 2022-04-22 NOTE — Assessment & Plan Note (Signed)
Will check RPR today.  

## 2022-04-22 NOTE — Assessment & Plan Note (Signed)
Patient doing well on [name].  Will check labs today and follow up in 6 months.  Refills sent as well.

## 2022-04-22 NOTE — Assessment & Plan Note (Signed)
Will give HPV #1 today and follow up vaccine in 2 and 6 months. Will give Menveo #1 today and follow up in 2 months for booster. Will give COVID booster today.

## 2022-04-22 NOTE — Assessment & Plan Note (Signed)
Will screen today 

## 2022-04-22 NOTE — Progress Notes (Signed)
[place] for Infectious Disease   CHIEF COMPLAINT    HIV follow up.    SUBJECTIVE:    Collin Thomas is a 43 y.o. male with PMHx as below who presents to the clinic for HIV follow up.   Patient here today for routine follow up and doing well on [name] daily.  No issues with adherence or tolerance of medication.  He was last seen in March 2023 and he has had labs done in May and September 2023 indicating viral load is undetectable and CD4 count is > 800.  He reports no particular issues otherwise today other than a sore throat that likely is seasonal.  Please see A&P for the details of today's visit and status of the patient's medical problems.   Patient's Medications  New Prescriptions   No medications on file  Previous Medications   VALACYCLOVIR (VALTREX) 1000 MG TABLET    Take 1 tablet daily for 5 days during HSV outbreak as needed.  Modified Medications   Modified Medication Previous Medication   BICTEGRAVIR-EMTRICITABINE-TENOFOVIR AF ([name]) 50-200-25 MG TABS TABLET bictegravir-emtricitabine-tenofovir AF ([name]) 50-200-25 MG TABS tablet      Take 1 tablet by mouth daily.    Take 1 tablet by mouth daily.  Discontinued Medications   HOMEOPATHIC PRODUCTS (EAR PAIN RELIEF HOMEOPATHIC OT)    Place 5 drops into the left ear daily as needed (ear pain).   MUPIROCIN CREAM (BACTROBAN) 2 %    Apply 1 application. topically 2 (two) times daily.   VALACYCLOVIR (VALTREX) 500 MG TABLET    Take 500 mg by mouth 2 (two) times daily as needed (outbreaks).      Past Medical History:  Diagnosis Date   Gonorrhea    Herpes    Syphilis     Social History   Tobacco Use   Smoking status: Every Day   Smokeless tobacco: Former  Substance Use Topics   Alcohol use: Yes    Comment: occ   Drug use: Not Currently    No family history on file.  No Known Allergies  Review of Systems  All other systems reviewed and are negative.    OBJECTIVE:    Vitals:    04/22/22 0945  BP: 114/75  Pulse: 68  Temp: (!) 97.2 F (36.2 C)  TempSrc: Temporal  SpO2: 98%  Weight: 166 lb (75.3 kg)  Height: 6\' 3"  (1.905 m)     Body mass index is 20.75 kg/m.  Physical Exam Constitutional:      Appearance: Normal appearance.  Abdominal:     General: There is no distension.     Palpations: Abdomen is soft.  Musculoskeletal:        General: Normal range of motion.  Skin:    General: Skin is warm and dry.  Neurological:     General: No focal deficit present.     Mental Status: He is alert and oriented to person, place, and time.  Psychiatric:        Mood and Affect: Mood normal.        Behavior: Behavior normal.     Labs and Microbiology:    Latest Ref Rng & Units 06/21/2021   11:17 AM 06/17/2021    7:44 AM 05/17/2021    3:22 PM  CMP  Glucose 65 - 99 mg/dL 90  101  88   BUN 7 - 25 mg/dL 22  15  17    Creatinine 0.60 - 1.26 mg/dL 1.14  1.28  1.28   Sodium 135 - 146 mmol/L 138  137  141   Potassium 3.5 - 5.3 mmol/L 4.2  4.0  4.2   Chloride 98 - 110 mmol/L 105  105  107   CO2 20 - 32 mmol/L 23  23  26    Calcium 8.6 - 10.3 mg/dL 9.5  9.1  9.5   Total Protein 6.1 - 8.1 g/dL 7.3   7.3   Total Bilirubin 0.2 - 1.2 mg/dL 0.5   0.6   AST 10 - 40 U/L 16   20   ALT 9 - 46 U/L 25   22       Latest Ref Rng & Units 06/17/2021    7:44 AM 05/04/2021    9:42 AM 07/23/2020   12:23 PM  CBC  WBC 4.0 - 10.5 K/uL 6.0  3.9  10.3   Hemoglobin 13.0 - 17.0 g/dL 09/22/2020  17.5  10.2   Hematocrit 39.0 - 52.0 % 41.9  44.3  41.8   Platelets 150 - 400 K/uL 249  135  276      Lab Results  Component Value Date   HIV1RNAQUANT Not Detected 01/11/2022   HIV1RNAQUANT Not Detected 09/12/2021   HIV1RNAQUANT <20 (H) 06/21/2021   CD4TABS 639 09/12/2021    RPR and STI: Lab Results  Component Value Date   LABRPR NON-REACTIVE 06/21/2021   LABRPR REACTIVE (A) 05/10/2021   RPRTITER 1:2 (H) 05/10/2021    STI Results GC CT  05/10/2021  4:08 PM Negative    Negative    Negative   Negative    Negative    Negative     Hepatitis B: Lab Results  Component Value Date   HEPBSAB REACTIVE (A) 01/11/2022   HEPBSAG NON-REACTIVE 05/10/2021   HEPBCAB NON-REACTIVE 05/10/2021   Hepatitis C: Lab Results  Component Value Date   HEPCAB NON-REACTIVE 05/10/2021   Hepatitis A: Lab Results  Component Value Date   HAV NON-REACTIVE 05/10/2021   Lipids: Lab Results  Component Value Date   CHOL 163 05/17/2021   TRIG 138 05/17/2021   HDL 29 (L) 05/17/2021   CHOLHDL 5.6 (H) 05/17/2021   LDLCALC 108 (H) 05/17/2021      ASSESSMENT & PLAN:    Vaccine counseling Will give HPV #1 today and follow up vaccine in 2 and 6 months. Will give Menveo #1 today and follow up in 2 months for booster. Will give COVID booster today.  Syphilis Will check RPR today.  Routine screening for STI (sexually transmitted infection) Will screen today.  HIV disease [facility]) Patient doing well on [name].  Will check labs today and follow up in 6 months.  Refills sent as well.    Orders Placed This Encounter  Procedures   COMPLETE METABOLIC PANEL WITH GFR   CBC   HIV-1 RNA quant-no reflex-bld   T-helper cell (CD4)- ([facility] clinic only)   RPR      [facility] for Infectious Disease [facility] 04/22/2022, 10:26 AM

## 2022-04-22 NOTE — Addendum Note (Signed)
Addended by: Adelfa Koh on: 04/22/2022 11:09 AM   Modules accepted: Orders

## 2022-04-22 NOTE — Patient Instructions (Signed)
Thank you for coming to see me today. It was a pleasure seeing you.  To Do: Labs and swabs today Vaccine today and again in 2 months (just needs a nurse visit for vaccine) Follow up with me in 6 months  If you have any questions or concerns, please do not hesitate to call the office at [phone].  Take Care,   Jule Ser

## 2022-04-23 ENCOUNTER — Encounter: Payer: Self-pay | Admitting: Internal Medicine

## 2022-04-23 DIAGNOSIS — B20 Human immunodeficiency virus [HIV] disease: Secondary | ICD-10-CM

## 2022-04-23 LAB — CYTOLOGY, (ORAL, ANAL, URETHRAL) ANCILLARY ONLY
Chlamydia: NEGATIVE
Chlamydia: NEGATIVE
Comment: NEGATIVE
Comment: NEGATIVE
Comment: NORMAL
Comment: NORMAL
Neisseria Gonorrhea: NEGATIVE
Neisseria Gonorrhea: NEGATIVE

## 2022-04-23 LAB — URINE CYTOLOGY ANCILLARY ONLY
Chlamydia: NEGATIVE
Comment: NEGATIVE
Comment: NORMAL
Neisseria Gonorrhea: NEGATIVE

## 2022-04-24 LAB — HIV-1 RNA QUANT-NO REFLEX-BLD
HIV 1 RNA Quant: NOT DETECTED Copies/mL
HIV-1 RNA Quant, Log: NOT DETECTED Log cps/mL

## 2022-04-24 LAB — COMPLETE METABOLIC PANEL WITH GFR
AG Ratio: 1.6 (calc) (ref 1.0–2.5)
ALT: 19 U/L (ref 9–46)
AST: 18 U/L (ref 10–40)
Albumin: 4.5 g/dL (ref 3.6–5.1)
Alkaline phosphatase (APISO): 45 U/L (ref 36–130)
BUN: 13 mg/dL (ref 7–25)
CO2: 27 mmol/L (ref 20–32)
Calcium: 9.7 mg/dL (ref 8.6–10.3)
Chloride: 105 mmol/L (ref 98–110)
Creat: 1.03 mg/dL (ref 0.60–1.29)
Globulin: 2.8 g/dL (calc) (ref 1.9–3.7)
Glucose, Bld: 91 mg/dL (ref 65–99)
Potassium: 4.1 mmol/L (ref 3.5–5.3)
Sodium: 140 mmol/L (ref 135–146)
Total Bilirubin: 0.4 mg/dL (ref 0.2–1.2)
Total Protein: 7.3 g/dL (ref 6.1–8.1)
eGFR: 94 mL/min/{1.73_m2} (ref >=60)

## 2022-04-24 LAB — CBC
HCT: 43 % (ref 38.5–50.0)
Hemoglobin: 15 g/dL (ref 13.2–17.1)
MCH: 31.9 pg (ref 27.0–33.0)
MCHC: 34.9 g/dL (ref 32.0–36.0)
MCV: 91.5 fL (ref 80.0–100.0)
MPV: 9.4 fL (ref 7.5–12.5)
Platelets: 256 10*3/uL (ref 140–400)
RBC: 4.7 10*6/uL (ref 4.20–5.80)
RDW: 12 % (ref 11.0–15.0)
WBC: 2.9 10*3/uL — ABNORMAL LOW (ref 3.8–10.8)

## 2022-04-24 LAB — RPR: RPR Ser Ql: NONREACTIVE

## 2022-04-25 LAB — HELPER T-LYMPH-CD4 (ARMC ONLY)
% CD 4 Pos. Lymph.: 49.2 % (ref 30.8–58.5)
Absolute CD 4 Helper: 738 /uL (ref 359–1519)
Basophils Absolute: 0 10*3/uL (ref 0.0–0.2)
Basos: 1 %
EOS (ABSOLUTE): 0 10*3/uL (ref 0.0–0.4)
Eos: 1 %
Hematocrit: 45 % (ref 37.5–51.0)
Hemoglobin: 15.1 g/dL (ref 13.0–17.7)
Immature Grans (Abs): 0 10*3/uL (ref 0.0–0.1)
Immature Granulocytes: 0 %
Lymphocytes Absolute: 1.5 10*3/uL (ref 0.7–3.1)
Lymphs: 50 %
MCH: 32.3 pg (ref 26.6–33.0)
MCHC: 33.6 g/dL (ref 31.5–35.7)
MCV: 96 fL (ref 79–97)
Monocytes Absolute: 0.6 10*3/uL (ref 0.1–0.9)
Monocytes: 19 %
Neutrophils Absolute: 0.8 10*3/uL — ABNORMAL LOW (ref 1.4–7.0)
Neutrophils: 29 %
Platelets: 265 10*3/uL (ref 150–450)
RBC: 4.67 x10E6/uL (ref 4.14–5.80)
RDW: 12 % (ref 11.6–15.4)
WBC: 2.9 10*3/uL — ABNORMAL LOW (ref 3.4–10.8)

## 2022-05-17 ENCOUNTER — Other Ambulatory Visit: Payer: BC Managed Care – PPO

## 2022-05-27 DIAGNOSIS — B351 Tinea unguium: Secondary | ICD-10-CM | POA: Diagnosis not present

## 2022-05-27 DIAGNOSIS — L309 Dermatitis, unspecified: Secondary | ICD-10-CM | POA: Diagnosis not present

## 2022-05-27 DIAGNOSIS — L720 Epidermal cyst: Secondary | ICD-10-CM | POA: Diagnosis not present

## 2022-05-27 DIAGNOSIS — L738 Other specified follicular disorders: Secondary | ICD-10-CM | POA: Diagnosis not present

## 2022-08-21 ENCOUNTER — Telehealth: Payer: Self-pay | Admitting: Pharmacist

## 2022-08-21 NOTE — Telephone Encounter (Signed)
Collin Thomas called to request a refill for Valtrex. [facility] He can be reached at [phone].

## 2022-08-21 NOTE — Telephone Encounter (Signed)
My view shows he still has 2 refills left that should not have expired.

## 2022-08-28 ENCOUNTER — Encounter: Payer: Self-pay | Admitting: Family

## 2022-08-28 ENCOUNTER — Other Ambulatory Visit (HOSPITAL_COMMUNITY)
Admission: RE | Admit: 2022-08-28 | Discharge: 2022-08-28 | Disposition: A | Discharge status: Home / Self Care | Payer: BC Managed Care – PPO | Source: Ambulatory Visit | Attending: Family | Admitting: Family

## 2022-08-28 ENCOUNTER — Ambulatory Visit: Payer: BC Managed Care – PPO | Admitting: Family

## 2022-08-28 ENCOUNTER — Other Ambulatory Visit: Payer: Self-pay

## 2022-08-28 VITALS — BP 107/72 | HR 70 | Temp 98.5°F | Wt 160.6 lb

## 2022-08-28 DIAGNOSIS — B20 Human immunodeficiency virus [HIV] disease: Secondary | ICD-10-CM | POA: Diagnosis not present

## 2022-08-28 DIAGNOSIS — Z113 Encounter for screening for infections with a predominantly sexual mode of transmission: Secondary | ICD-10-CM | POA: Insufficient documentation

## 2022-08-28 DIAGNOSIS — Z0000 Encounter for general adult medical examination without abnormal findings: Secondary | ICD-10-CM

## 2022-08-28 DIAGNOSIS — Z23 Encounter for immunization: Secondary | ICD-10-CM

## 2022-08-28 DIAGNOSIS — R5383 Other fatigue: Secondary | ICD-10-CM

## 2022-08-28 HISTORY — DX: Encounter for general adult medical examination without abnormal findings: Z00.00

## 2022-08-28 HISTORY — DX: Other fatigue: R53.83

## 2022-08-28 NOTE — Assessment & Plan Note (Signed)
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 Could be medication related.  Unlikely that he has diabetes but will check A1c. Does not appear to have any symptoms associated with sleep apnea so no indication for testing at this point. Smokes occasionally with no other clear risk factors for CVD. Check basic blood work and continue to monitor symptoms for now and follow up pending lab work results as needed or for worsening of symptoms.

## 2022-08-28 NOTE — Patient Instructions (Addendum)
Nice to see you. ? ?We will check your lab work today. ? ?Continue to take your medication daily as prescribed. ? ?Refills have been sent to the pharmacy. ? ?Plan for follow up in 6 months or sooner if needed with lab work on the same day. ? ?Have a great day and stay safe! ? ?

## 2022-08-28 NOTE — Progress Notes (Signed)
Brief Narrative   Patient ID: Collin Thomas, male    DOB: Aug 24, 1980, 42 y.o.   MRN: [id number]  Collin Thomas is a 42 y/o male diagnosed with HIV disease in January 2023 with risk factor of MSM. Initial viral load was 6.8 million with CD4 count 610. Genotype with no significant medication resistant mutations. Entered care at [facility] Stage 1. No history of opportunistic infection. Sole ART regimen of [name].   Subjective:    Chief Complaint  Patient presents with   Follow-up    Patient reports having a lot of fatigue, cold sweats, dry mouth, frequent urination x 2 weeks. Patient denies fever. Patient also having lower back pain     HPI:  Collin Thomas is a 42 y.o. male with HIV disease last seen on by Dr. Earlene Plater on 04/22/22 with well controlled virus and good adherence and tolerance to [name]. Viral load was undetectable and CD4 count 738. STD testing was negative. Kidney function, liver function and electrolytes were normal. Here today for an acute office visit.   Collin Thomas has been doing okay since his last office visit and continues to take [facility] as prescribed with no adverse side effects. Has several concerns about recent symptoms that he has been having including fatigue, cold sweats, dry mouth and frequent urination. 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 Has stopped drinking caffeine recently. Concerned about potential for diabetes. No fevers or recent illness. Struggling with pollen and allergies and not currently taking anything. Condoms and STD testing offered. Due for 2nd dose of HPV and Menevo.   Denies fevers, chills, headaches, changes in vision, neck pain/stiffness, nausea, diarrhea, vomiting, lesions or rashes.    Allergies   Allergen Reactions   Bee Pollen Cough    Hay fever symptoms       Outpatient Medications Prior to Visit  Medication Sig Dispense Refill   bictegravir-emtricitabine-tenofovir AF ([name]) 50-200-25 MG TABS tablet Take 1 tablet by mouth daily. 90 tablet 3   terbinafine (LAMISIL) 250 MG tablet Take 250 mg by mouth daily.     triamcinolone cream (KENALOG) 0.1 % Apply topically 2 (two) times daily.     valACYclovir (VALTREX) 1000 MG tablet Take 1 tablet daily for 5 days during HSV outbreak as needed. 30 tablet 2   No facility-administered medications prior to visit.     Past Medical History:  Diagnosis Date   Gonorrhea    Herpes    Syphilis      History reviewed. No pertinent surgical history.    Review of Systems  Constitutional:  Positive for fatigue. Negative for appetite change, chills, fever and unexpected weight change.  Eyes:  Negative for visual disturbance.  Respiratory:  Negative for cough, chest tightness, shortness of breath and wheezing.   Cardiovascular:  Negative for chest pain and leg swelling.  Gastrointestinal:  Negative for abdominal pain, constipation, diarrhea, nausea and vomiting.  Genitourinary:  Negative for dysuria, flank pain, frequency, genital sores, hematuria and urgency.  Skin:  Negative for rash.  Allergic/Immunologic: Negative for immunocompromised state.  Neurological:  Negative for dizziness and headaches.      Objective:    BP 107/72   Pulse 70   Temp 98.5 F (36.9 C) (Oral)   Wt 160 lb 9.6  oz (72.8 kg)   SpO2 98%   BMI 20.07 kg/m  Nursing note and vital signs reviewed.  Physical Exam Constitutional:      General: He is not in acute distress.    Appearance: He is well-developed.  Cardiovascular:     Rate and Rhythm: Normal rate and regular rhythm.     Heart sounds: Normal heart sounds.  Pulmonary:     Effort: Pulmonary effort is normal.     Breath sounds: Normal breath sounds.  Skin:    General: Skin is warm and dry.   Neurological:     Mental Status: He is alert and oriented to person, place, and time.  Psychiatric:        Behavior: Behavior normal.        Thought Content: Thought content normal.        Judgment: Judgment normal.         04/22/2022    9:48 AM 05/10/2021    3:36 PM  Depression screen PHQ 2/9  Decreased Interest 0 3  Down, Depressed, Hopeless 0 3  PHQ - 2 Score 0 6  Altered sleeping  3  Tired, decreased energy  3  Change in appetite  2  Feeling bad or failure about yourself   0  Trouble concentrating  0  Moving slowly or fidgety/restless  0  Suicidal thoughts  0  PHQ-9 Score  14  Difficult doing work/chores  Not difficult at all       Assessment & Plan:    Patient Active Problem List   Diagnosis Date Noted   Healthcare maintenance 08/28/2022   Other fatigue 08/28/2022   Impetigo 06/21/2021   Vaccine counseling 05/17/2021   Routine screening for STI (sexually transmitted infection) 05/17/2021   Syphilis 05/17/2021   Encounter for medication monitoring 05/17/2021   Ear fullness 05/17/2021   HIV disease (HCC) 05/10/2021   High risk sexual behavior 05/10/2021     Problem List Items Addressed This Visit       Other   HIV disease (HCC) - Primary    Collin Thomas continues to have well controlled virus with good adherence and tolerance to [employer]. Reviewed previous lab work and discussed plan of care. Check lab work. Current symptoms are unlikely related to [facility] and will continue with current dose. Plan for follow up in 6 months or sooner if needed with lab work on the same day.       Relevant Medications   terbinafine (LAMISIL) 250 MG tablet   Other Relevant Orders   COMPLETE METABOLIC PANEL WITH GFR   CBC with Differential/Platelet   HIV-1 RNA quant-no reflex-bld   T-helper cell (CD4)- ([facility] clinic only)   Healthcare maintenance    Discussed importance of safe sexual practice and condom use. Condoms and STD testing offered.  Menveo and HPV updated.   Introduced anal pap smear and anal cancer screening and will consider at next office visit.       Other fatigue    Collin Thomas has fatigue of unclear origin and likely multifactorial given 3rd shift work and changes in caffeine intake. Could be medication related.  Unlikely that he has diabetes but will check A1c. Does not appear to have any symptoms associated with sleep apnea so no indication for testing at this point. Smokes occasionally with no other clear risk factors for CVD. Check basic blood work and continue to monitor symptoms for now and follow up pending lab work results as needed or for worsening of symptoms.  Relevant Orders   HgB A1c   Other Visit Diagnoses     Screening for STDs (sexually transmitted diseases)       Relevant Orders   RPR   Cytology (oral, anal, urethral) ancillary only   Cytology (oral, anal, urethral) ancillary only   Urine cytology ancillary only   Need for meningitis vaccination       Relevant Orders   MENINGOCOCCAL MCV4O(MENVEO) (Completed)   Need for HPV vaccination       Relevant Orders   HPV 9-valent vaccine,Recombinat (Completed)        I am having Collin Thomas maintain his valACYclovir, [name], triamcinolone cream, and terbinafine.   No orders of the defined types were placed in this encounter.    Follow-up: Return in about 6 months (around 02/28/2023), or if symptoms worsen or fail to improve.   Marcos Eke, MSN, FNP-C Nurse Practitioner [facility] for Infectious Disease [facility] Main number: [phone]

## 2022-08-28 NOTE — Assessment & Plan Note (Signed)
Sebastian continues to have well controlled virus with good adherence and tolerance to [employer]. Reviewed previous lab work and discussed plan of care. Check lab work. Current symptoms are unlikely related to [facility] and will continue with current dose. Plan for follow up in 6 months or sooner if needed with lab work on the same day.

## 2022-08-28 NOTE — Assessment & Plan Note (Signed)
Discussed importance of safe sexual practice and condom use. Condoms and STD testing offered.  Menveo and HPV updated.  Introduced anal pap smear and anal cancer screening and will consider at next office visit.

## 2022-08-29 ENCOUNTER — Ambulatory Visit: Payer: BC Managed Care – PPO | Admitting: Podiatry

## 2022-08-29 DIAGNOSIS — B351 Tinea unguium: Secondary | ICD-10-CM

## 2022-08-29 LAB — HEMOGLOBIN A1C
Mean Plasma Glucose: 100 mg/dL
eAG (mmol/L): 5.5 mmol/L

## 2022-08-29 LAB — CYTOLOGY, (ORAL, ANAL, URETHRAL) ANCILLARY ONLY
Chlamydia: NEGATIVE
Chlamydia: NEGATIVE
Comment: NEGATIVE
Comment: NEGATIVE
Comment: NORMAL
Comment: NORMAL
Neisseria Gonorrhea: NEGATIVE
Neisseria Gonorrhea: NEGATIVE

## 2022-08-29 LAB — URINE CYTOLOGY ANCILLARY ONLY
Chlamydia: NEGATIVE
Comment: NEGATIVE
Comment: NORMAL
Neisseria Gonorrhea: NEGATIVE

## 2022-08-29 LAB — T-HELPER CELL (CD4) - (RCID CLINIC ONLY)
CD4 % Helper T Cell: 45 % (ref 33–65)
CD4 T Cell Abs: 763 /uL (ref 400–1790)

## 2022-08-29 MED ORDER — TERBINAFINE HCL 250 MG PO TABS: ORAL_TABLET | ORAL | 0 refills | Status: DC

## 2022-08-29 NOTE — Progress Notes (Signed)
Subjective:   Patient ID: Collin Thomas, male   DOB: 42 y.o.   MRN: [id number]   HPI Patient presents about discoloration concerns left big toenail with a history of having taken 90 days of oral but is just finishing now.  Has done well with laser in the past would like to continue some treatment and also has concerns between his toes with white between the fourth and fifth toes both feet   ROS      Objective:  Physical Exam  Neurovascular status found to be intact muscle strength adequate with patient found to have moderate trauma to the left hallux nail with probable fungal infiltration localized with slight changes between the fourth and fifth digits bilateral     Assessment:  Low-grade fungal infection left hallux with history of probable trauma to the bed and history of oral antifungal agent     Plan:  H&P reviewed discussed at great length.  At this point we going to start him in the future on a pulse antifungal and I wrote the prescription today really has not started that which would be 1 pill a day for 7 days would be September or October.  I did write also for formula 3 and we will do 2-3 laser treatments that have been effective in the past and he can use Lamisil cream in between the fourth and fifth toes

## 2022-08-31 LAB — CBC WITH DIFFERENTIAL/PLATELET
Absolute Monocytes: 437 cells/uL (ref 200–950)
Basophils Absolute: 84 cells/uL (ref 0–200)
Basophils Relative: 1 %
Eosinophils Absolute: 1890 cells/uL — ABNORMAL HIGH (ref 15–500)
Eosinophils Relative: 22.5 %
HCT: 50.2 % — ABNORMAL HIGH (ref 38.5–50.0)
Hemoglobin: 17.1 g/dL (ref 13.2–17.1)
Lymphs Abs: 1915 cells/uL (ref 850–3900)
MCH: 32.7 pg (ref 27.0–33.0)
MCHC: 34.1 g/dL (ref 32.0–36.0)
MCV: 96 fL (ref 80.0–100.0)
MPV: 9.4 fL (ref 7.5–12.5)
Monocytes Relative: 5.2 %
Neutro Abs: 4074 cells/uL (ref 1500–7800)
Neutrophils Relative %: 48.5 %
Platelets: 313 10*3/uL (ref 140–400)
RBC: 5.23 10*6/uL (ref 4.20–5.80)
RDW: 11.9 % (ref 11.0–15.0)
Total Lymphocyte: 22.8 %
WBC: 8.4 10*3/uL (ref 3.8–10.8)

## 2022-08-31 LAB — COMPLETE METABOLIC PANEL WITH GFR
AG Ratio: 1.7 (calc) (ref 1.0–2.5)
ALT: 16 U/L (ref 9–46)
AST: 19 U/L (ref 10–40)
Albumin: 5 g/dL (ref 3.6–5.1)
Alkaline phosphatase (APISO): 56 U/L (ref 36–130)
BUN: 10 mg/dL (ref 7–25)
CO2: 26 mmol/L (ref 20–32)
Calcium: 9.8 mg/dL (ref 8.6–10.3)
Chloride: 102 mmol/L (ref 98–110)
Creat: 1.17 mg/dL (ref 0.60–1.29)
Globulin: 3 g/dL (calc) (ref 1.9–3.7)
Glucose, Bld: 86 mg/dL (ref 65–99)
Potassium: 4.3 mmol/L (ref 3.5–5.3)
Sodium: 137 mmol/L (ref 135–146)
Total Bilirubin: 0.6 mg/dL (ref 0.2–1.2)
Total Protein: 8 g/dL (ref 6.1–8.1)
eGFR: 81 mL/min/{1.73_m2} (ref >=60)

## 2022-08-31 LAB — HEMOGLOBIN A1C: Hgb A1c MFr Bld: 5.1 % of total Hgb (ref <5.7)

## 2022-08-31 LAB — HIV-1 RNA QUANT-NO REFLEX-BLD
HIV 1 RNA Quant: NOT DETECTED Copies/mL
HIV-1 RNA Quant, Log: NOT DETECTED Log cps/mL

## 2022-08-31 LAB — RPR: RPR Ser Ql: NONREACTIVE

## 2022-09-12 DIAGNOSIS — L0889 Other specified local infections of the skin and subcutaneous tissue: Secondary | ICD-10-CM | POA: Diagnosis not present

## 2022-09-12 DIAGNOSIS — L02221 Furuncle of abdominal wall: Secondary | ICD-10-CM | POA: Diagnosis not present

## 2022-09-13 ENCOUNTER — Ambulatory Visit
Admission: EM | Admit: 2022-09-13 | Discharge: 2022-09-13 | Disposition: A | Discharge status: Home / Self Care | Payer: BC Managed Care – PPO | Attending: Nurse Practitioner | Admitting: Nurse Practitioner

## 2022-09-13 DIAGNOSIS — L02214 Cutaneous abscess of groin: Secondary | ICD-10-CM

## 2022-09-13 HISTORY — DX: Human immunodeficiency virus (HIV) disease: B20

## 2022-09-13 HISTORY — DX: Asymptomatic human immunodeficiency virus (hiv) infection status: Z21

## 2022-09-13 MED ORDER — LIDOCAINE-EPINEPHRINE (PF) 2 %-1:200000 IJ SOLN: 5.0000 mL | Freq: Once | INTRAMUSCULAR | Status: DC

## 2022-09-13 NOTE — Discharge Instructions (Addendum)
You were administered lidocaine with epi 2% as an analgesic for your I&D procedure.  You can resume your doxycycline and the Bactroban ointment as prescribed.  Avoid using the Bactroban internally but can be used to the outer area.

## 2022-09-13 NOTE — ED Triage Notes (Signed)
Pt reports he has a boil the groin area and right leg x 5 days. 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 States boil is getting bigger. Doxycyline prescribed yesterday.

## 2022-09-13 NOTE — ED Provider Notes (Signed)
[facility]    CSN: [id number] Arrival date & time: 09/13/22  1259      History   Chief Complaint Chief Complaint  Patient presents with   Abscess    HPI Collin Thomas is a 42 y.o. male.   HPI  He is in today requesting an I&D of a groin abscess.  He reports that he was at dermatology on yesterday they used a blade however only blood came out.  A call was obtained and he was started on doxycycline.  He reports that now the abscess is unbearable and he did call to dermatology this morning however the provider is already done for the day and he was told to come to urgent care.  He reports that he has started the doxycycline.  He does have 2 small areas to his right thigh. Past Medical History:  Diagnosis Date   Gonorrhea    Herpes    HIV (human immunodeficiency virus infection) (HCC)    Syphilis     Patient Active Problem List   Diagnosis Date Noted   Healthcare maintenance 08/28/2022   Other fatigue 08/28/2022   Impetigo 06/21/2021   Vaccine counseling 05/17/2021   Routine screening for STI (sexually transmitted infection) 05/17/2021   Syphilis 05/17/2021   Encounter for medication monitoring 05/17/2021   Ear fullness 05/17/2021   HIV disease (HCC) 05/10/2021   High risk sexual behavior 05/10/2021    History reviewed. No pertinent surgical history.     Home Medications    Prior to Admission medications   Medication Sig Start Date End Date Taking? Authorizing Provider  bictegravir-emtricitabine-tenofovir AF ([name]) 50-200-25 MG TABS tablet Take 1 tablet by mouth daily. 04/22/22   Kathlynn Grate, DO  terbinafine (LAMISIL) 250 MG tablet Take 250 mg by mouth daily. 05/27/22   [provider]  terbinafine (LAMISIL) 250 MG tablet Please take one a day x 7days, repeat every 4 weeks x 4 months 08/29/22   Lenn Sink, DPM  valACYclovir (VALTREX) 1000 MG tablet Take 1 tablet daily for 5 days during HSV outbreak as needed. 02/01/22   Jennette Kettle, RPH-CPP    Family History History reviewed. No pertinent family history.  Social History Social History   Tobacco Use   Smoking status: Some Days    Types: Cigarettes   Smokeless tobacco: Former  Substance Use Topics   Alcohol use: Yes    Comment: occ   Drug use: Not Currently     Allergies   Bee pollen   Review of Systems Review of Systems   Physical Exam Triage Vital Signs ED Triage Vitals  Enc Vitals Group     BP      Pulse      Resp      Temp      Temp src      SpO2      Weight      Height      Head Circumference      Peak Flow      Pain Score      Pain Loc      Pain Edu?      Excl. in GC?    No data found.  Updated Vital Signs BP 126/80 (BP Location: Left Arm)   Pulse 66   Temp 98 F (36.7 C) (Oral)   Resp 18   SpO2 97%   Visual Acuity Right Eye Distance:   Left Eye Distance:   Bilateral Distance:  Right Eye Near:   Left Eye Near:    Bilateral Near:     Physical Exam Constitutional:      General: He is not in acute distress.    Appearance: He is normal weight.  HENT:     Head: Normocephalic.  Skin:    General: Skin is warm.     Capillary Refill: Capillary refill takes less than 2 seconds.     Findings: Erythema present.  Neurological:     General: No focal deficit present.     Mental Status: He is alert and oriented to person, place, and time.  Psychiatric:        Mood and Affect: Mood normal.      UC Treatments / Results  Labs (all labs ordered are listed, but only abnormal results are displayed) Labs Reviewed - No data to display  EKG   Radiology No results found.  Procedures Incision and Drainage  Date/Time: 09/13/2022 2:04 PM  Performed by: Barbette Merino, NP Authorized by: Barbette Merino, NP   Consent:    Consent obtained:  Verbal   Consent given by:  Patient   Risks, benefits, and alternatives were discussed: yes     Risks discussed:  Bleeding, incomplete drainage, infection and  pain Universal protocol:    Procedure explained and questions answered to patient or proxy's satisfaction: yes     Relevant documents present and verified: no     Test results available : no     Imaging studies available: no     Required blood products, implants, devices, and special equipment available: no     Patient identity confirmed:  Verbally with patient Location:    Type:  Abscess   Location: groin. Pre-procedure details:    Skin preparation:  Povidone-iodine Sedation:    Sedation type:  None Anesthesia:    Anesthesia method:  Local infiltration   Local anesthetic:  Lidocaine 2% WITH epi Procedure type:    Complexity:  Simple Procedure details:    Ultrasound guidance: no     Needle aspiration: no     Incision types:  Single straight   Incision depth:  Dermal   Drainage:  Serosanguinous and bloody   Drainage amount:  Scant   Wound treatment:  Wound left open   Packing materials:  None Post-procedure details:    Procedure completion:  Tolerated well, no immediate complications  (including critical care time)  Medications Ordered in UC Medications - No data to display   Initial Impression / Assessment and Plan / UC Course  I have reviewed the triage vital signs and the nursing notes.  Pertinent labs & imaging results that were available during my care of the patient were reviewed by me and considered in my medical decision making (see chart for details).     Abscess Final Clinical Impressions(s) / UC Diagnoses   Final diagnoses:  Abscess, groin     Discharge Instructions      You were administered lidocaine with epi 2% as an analgesic for your I&D procedure.  You can resume your doxycycline and the Bactroban ointment as prescribed.  Avoid using the Bactroban internally but can be used to the outer area.     ED Prescriptions   None    PDMP not reviewed this encounter.   Thad Ranger [place], NP 09/13/22 1430

## 2022-09-17 ENCOUNTER — Ambulatory Visit: Payer: BC Managed Care – PPO | Admitting: Internal Medicine

## 2022-10-11 ENCOUNTER — Ambulatory Visit (INDEPENDENT_AMBULATORY_CARE_PROVIDER_SITE_OTHER): Payer: BC Managed Care – PPO | Admitting: *Deleted

## 2022-10-11 DIAGNOSIS — B351 Tinea unguium: Secondary | ICD-10-CM

## 2022-10-11 NOTE — Progress Notes (Signed)
Patient presents today for the 1st laser treatment. Diagnosed with mycotic nail infection by Dr. Charlsie Merles.   Toenail most affected, all nails, very mild.  All other systems are negative.  Nails were filed thin. Laser therapy was administered to 1-5 toenails bilateral and patient tolerated the treatment well. All safety precautions were in place.   Single laser pass was done on non-affected nails.  Patient is currently using Formula 7 and will be purchasing another bottle today. Also, he has completed a pulse dosing of lamisil.  Follow up in 4 weeks for laser # 2.

## 2022-10-28 ENCOUNTER — Ambulatory Visit: Payer: BC Managed Care – PPO | Admitting: Internal Medicine

## 2022-11-04 ENCOUNTER — Ambulatory Visit (INDEPENDENT_AMBULATORY_CARE_PROVIDER_SITE_OTHER): Payer: BC Managed Care – PPO | Admitting: Family

## 2022-11-04 ENCOUNTER — Other Ambulatory Visit (HOSPITAL_COMMUNITY)
Admission: RE | Admit: 2022-11-04 | Discharge: 2022-11-04 | Disposition: A | Discharge status: Home / Self Care | Payer: BC Managed Care – PPO | Source: Ambulatory Visit | Attending: Family | Admitting: Family

## 2022-11-04 ENCOUNTER — Other Ambulatory Visit: Payer: Self-pay

## 2022-11-04 ENCOUNTER — Encounter: Payer: Self-pay | Admitting: Family

## 2022-11-04 VITALS — BP 118/74 | HR 60 | Temp 97.9°F | Ht 75.0 in | Wt 162.0 lb

## 2022-11-04 DIAGNOSIS — Z113 Encounter for screening for infections with a predominantly sexual mode of transmission: Secondary | ICD-10-CM | POA: Insufficient documentation

## 2022-11-04 DIAGNOSIS — B20 Human immunodeficiency virus [HIV] disease: Secondary | ICD-10-CM | POA: Diagnosis not present

## 2022-11-04 DIAGNOSIS — R5383 Other fatigue: Secondary | ICD-10-CM

## 2022-11-04 DIAGNOSIS — Z23 Encounter for immunization: Secondary | ICD-10-CM | POA: Diagnosis not present

## 2022-11-04 DIAGNOSIS — L0100 Impetigo, unspecified: Secondary | ICD-10-CM

## 2022-11-04 DIAGNOSIS — Z0000 Encounter for general adult medical examination without abnormal findings: Secondary | ICD-10-CM

## 2022-11-04 DIAGNOSIS — B009 Herpesviral infection, unspecified: Secondary | ICD-10-CM | POA: Diagnosis not present

## 2022-11-04 MED ORDER — VALACYCLOVIR HCL 1 G PO TABS: ORAL_TABLET | ORAL | 2 refills | Status: DC

## 2022-11-04 MED ORDER — MUPIROCIN 2 % EX OINT: 1.0000 | TOPICAL_OINTMENT | Freq: Two times a day (BID) | CUTANEOUS | 0 refills | Status: DC

## 2022-11-04 MED ORDER — CEFADROXIL 500 MG PO CAPS: 500.0000 mg | ORAL_CAPSULE | Freq: Two times a day (BID) | ORAL | 0 refills | Status: DC

## 2022-11-04 NOTE — Assessment & Plan Note (Signed)
Collin Thomas continues to have well controlled virus with good adherence and tolerance to [employer]. Reviewed previous lab work and discussed plan of care and U equals U. Check lab work. Continue current dose of [name]. Plan for follow up in 6 months or sooner if needed with lab work on the same day.

## 2022-11-04 NOTE — Patient Instructions (Signed)
Nice to see you. ? ?We will check your lab work today. ? ?Continue to take your medication daily as prescribed. ? ?Refills have been sent to the pharmacy. ? ?Plan for follow up in 6 months or sooner if needed with lab work on the same day. ? ?Have a great day and stay safe! ? ?

## 2022-11-04 NOTE — Assessment & Plan Note (Signed)
Facial lesion located on the left lower chin appears consistent with impetigo. Cleanse with soap and water and continue with current dose of Bactroban. Instructed to put a very small amount just to cover the area. Will give course of Cefadroxil 500 mg PO bid x 7 days. Follow up for any worsening.

## 2022-11-04 NOTE — Assessment & Plan Note (Signed)
Unclear cause of fatigue with previous work up primarily unremarkable. Discussed completion of FMLA paperwork and will complete for 1-2 times per month for 1 day at a time.

## 2022-11-04 NOTE — Assessment & Plan Note (Signed)
Discussed importance of safe sexual practice and condom use. Condoms and STD testing offered.  Tetanus updated. Routine dental care up to date.  Consider rectal cancer screening at next office visit.

## 2022-11-04 NOTE — Progress Notes (Signed)
Brief Narrative   Patient ID: Collin Thomas, male    DOB: 12/27/1980, 42 y.o.   MRN: [id number]  Collin Thomas is a 42 y/o male diagnosed with HIV disease in January 2023 with risk factor of MSM. Initial viral load was 6.8 million with CD4 count 610. Genotype with no significant medication resistant mutations. Entered care at [facility] Stage 1. No history of opportunistic infection. Sole ART regimen of [name].   Subjective:    Chief Complaint  Patient presents with   Follow-up    B20   HIV Positive/AIDS    HPI:  Collin Thomas is a 42 y.o. male with HIV disease last seen on 08/28/22 with well controlled virus and good adherence and tolerance to [name]. Viral load was undetectable and CD4 count 763. Renal function, liver function and electrolytes within normal ranges. Concern for continued fatigue at times and in the interim has requested completion of FMLA paper work for intermittent leave. Here today for follow up.  Collin Thomas doing okay since last office visit and continues to have fatigue as well as concern for an area on his face that was previously treated for impetigo that has come back over the last week or so and has Thomas using Bactroban cream with some improvement. Continues to take [name] as prescribed with no adverse side effects or problems obtaining medication. Fatigue has Thomas waxing and waning and is a cause he has missed work recently. Exacerbations appear to be occurring 1-2 times per month on average and has concern that his job uses a point system where he is concerned he may lose his job without FMLA paperwork completion. Condoms and STD testing offered. Healthcare maintenance due includes tetanus. Routine dental care up to date per recommendations.  Denies fevers, chills, night sweats, headaches, changes in vision, neck pain/stiffness, nausea, diarrhea, vomiting, lesions or rashes.  Allergies  Allergen Reactions   Bee Pollen Cough    Hay fever symptoms        Outpatient Medications Prior to Visit  Medication Sig Dispense Refill   bictegravir-emtricitabine-tenofovir AF ([name]) 50-200-25 MG TABS tablet Take 1 tablet by mouth daily. 90 tablet 3   terbinafine (LAMISIL) 250 MG tablet Please take one a day x 7days, repeat every 4 weeks x 4 months 28 tablet 0   valACYclovir (VALTREX) 1000 MG tablet Take 1 tablet daily for 5 days during HSV outbreak as needed. 30 tablet 2   terbinafine (LAMISIL) 250 MG tablet Take 250 mg by mouth daily. (Patient not taking: Reported on 11/04/2022)     No facility-administered medications prior to visit.     Past Medical History:  Diagnosis Date   Gonorrhea    Herpes    HIV (human immunodeficiency virus infection) (HCC)    Syphilis      History reviewed. No pertinent surgical history.    Review of Systems  Constitutional:  Positive for fatigue. Negative for appetite change, chills, fever and unexpected weight change.  Eyes:  Negative for visual disturbance.  Respiratory:  Negative for cough, chest tightness, shortness of breath and wheezing.   Cardiovascular:  Negative for chest pain and leg swelling.  Gastrointestinal:  Negative for abdominal pain, constipation, diarrhea, nausea and vomiting.  Genitourinary:  Negative for dysuria, flank pain, frequency, genital sores, hematuria and urgency.  Skin:  Negative for rash.  Allergic/Immunologic: Negative for immunocompromised state.  Neurological:  Negative for dizziness and headaches.      Objective:    BP 118/74  Pulse 60   Temp 97.9 F (36.6 C) (Temporal)   Ht 6\' 3"  (1.905 m)   Wt 162 lb (73.5 kg)   BMI 20.25 kg/m  Nursing note and vital signs reviewed.  Physical Exam Constitutional:      General: He is not in acute distress.    Appearance: He is well-developed.  Eyes:     Conjunctiva/sclera: Conjunctivae normal.  Cardiovascular:     Rate and Rhythm: Normal rate and regular rhythm.     Heart sounds: Normal heart sounds. No murmur  heard.    No friction rub. No gallop.  Pulmonary:     Effort: Pulmonary effort is normal. No respiratory distress.     Breath sounds: Normal breath sounds. No wheezing or rales.  Chest:     Chest wall: No tenderness.  Abdominal:     General: Bowel sounds are normal.     Palpations: Abdomen is soft.     Tenderness: There is no abdominal tenderness.  Musculoskeletal:     Cervical back: Neck supple.  Lymphadenopathy:     Cervical: No cervical adenopathy.  Skin:    General: Skin is warm and dry.     Findings: No rash.  Neurological:     Mental Status: He is alert and oriented to person, place, and time.  Psychiatric:        Behavior: Behavior normal.        Thought Content: Thought content normal.        Judgment: Judgment normal.         11/04/2022   10:32 AM 04/22/2022    9:48 AM 05/10/2021    3:36 PM  Depression screen PHQ 2/9  Decreased Interest 0 0 3  Down, Depressed, Hopeless 0 0 3  PHQ - 2 Score 0 0 6  Altered sleeping   3  Tired, decreased energy   3  Change in appetite   2  Feeling bad or failure about yourself    0  Trouble concentrating   0  Moving slowly or fidgety/restless   0  Suicidal thoughts   0  PHQ-9 Score   14  Difficult doing work/chores   Not difficult at all       Assessment & Plan:    Patient Active Problem List   Diagnosis Date Noted   Healthcare maintenance 08/28/2022   Other fatigue 08/28/2022   Impetigo 06/21/2021   Vaccine counseling 05/17/2021   Routine screening for STI (sexually transmitted infection) 05/17/2021   Syphilis 05/17/2021   Encounter for medication monitoring 05/17/2021   Ear fullness 05/17/2021   HIV disease (HCC) 05/10/2021   High risk sexual behavior 05/10/2021     Problem List Items Addressed This Visit       Musculoskeletal and Integument   Impetigo    Facial lesion located on the left lower chin appears consistent with impetigo. Cleanse with soap and water and continue with current dose of Bactroban.  Instructed to put a very small amount just to cover the area. Will give course of Cefadroxil 500 mg PO bid x 7 days. Follow up for any worsening.       Relevant Medications   valACYclovir (VALTREX) 1000 MG tablet   cefadroxil (DURICEF) 500 MG capsule   mupirocin ointment (BACTROBAN) 2 %     Other   HIV disease (HCC) - Primary    Collin Thomas continues to have well controlled virus with good adherence and tolerance to [employer]. Reviewed previous lab work and discussed plan  of care and U equals U. Check lab work. Continue current dose of [name]. Plan for follow up in 6 months or sooner if needed with lab work on the same day.       Relevant Medications   valACYclovir (VALTREX) 1000 MG tablet   cefadroxil (DURICEF) 500 MG capsule   mupirocin ointment (BACTROBAN) 2 %   Other Relevant Orders   HIV-1 RNA quant-no reflex-bld   T-helper cell (CD4)- ([facility] clinic only)   BASIC METABOLIC PANEL WITH GFR   Healthcare maintenance    Discussed importance of safe sexual practice and condom use. Condoms and STD testing offered.  Tetanus updated. Routine dental care up to date.  Consider rectal cancer screening at next office visit.       Other fatigue    Unclear cause of fatigue with previous work up primarily unremarkable. Discussed completion of FMLA paperwork and will complete for 1-2 times per month for 1 day at a time.      Other Visit Diagnoses     HSV (herpes simplex virus) infection       Relevant Medications   valACYclovir (VALTREX) 1000 MG tablet   cefadroxil (DURICEF) 500 MG capsule   mupirocin ointment (BACTROBAN) 2 %   Screening for STDs (sexually transmitted diseases)       Relevant Orders   RPR   Cytology (oral, anal, urethral) ancillary only   Cytology (oral, anal, urethral) ancillary only   Urine cytology ancillary only   Need for tetanus booster       Relevant Orders   Tdap vaccine greater than or equal to 7yo IM (Completed)        I am having Collin Thomas  start on cefadroxil and mupirocin ointment. I am also having him maintain his [name], terbinafine, and valACYclovir.   Meds ordered this encounter  Medications   valACYclovir (VALTREX) 1000 MG tablet    Sig: Take 1 tablet daily for 5 days during HSV outbreak as needed.    Dispense:  30 tablet    Refill:  2    Order Specific Question:   Supervising Provider    Answer:   Drue Second, CYNTHIA [[id number]]   cefadroxil (DURICEF) 500 MG capsule    Sig: Take 1 capsule (500 mg total) by mouth 2 (two) times daily.    Dispense:  14 capsule    Refill:  0    Order Specific Question:   Supervising Provider    Answer:   Drue Second, CYNTHIA [[id number]]   mupirocin ointment (BACTROBAN) 2 %    Sig: Apply 1 Application topically 2 (two) times daily.    Dispense:  22 g    Refill:  0    Order Specific Question:   Supervising Provider    Answer:   Judyann Munson [[id number]]     Follow-up: Return in about 6 months (around 05/07/2023), or if symptoms worsen or fail to improve.   Marcos Eke, MSN, FNP-C Nurse Practitioner [facility] Main number: [phone]

## 2022-11-05 LAB — BASIC METABOLIC PANEL WITH GFR
BUN: 12 mg/dL (ref 7–25)
Chloride: 102 mmol/L (ref 98–110)
Glucose, Bld: 82 mg/dL (ref 65–99)
eGFR: 86 mL/min/{1.73_m2} (ref >=60)

## 2022-11-05 LAB — T-HELPER CELL (CD4) - (RCID CLINIC ONLY)
CD4 % Helper T Cell: 44 % (ref 33–65)
CD4 T Cell Abs: 818 /uL (ref 400–1790)

## 2022-11-05 LAB — RPR: RPR Ser Ql: NONREACTIVE

## 2022-11-06 LAB — HIV-1 RNA QUANT-NO REFLEX-BLD
HIV 1 RNA Quant: NOT DETECTED Copies/mL
HIV-1 RNA Quant, Log: NOT DETECTED Log cps/mL

## 2022-11-06 LAB — BASIC METABOLIC PANEL WITH GFR
CO2: 29 mmol/L (ref 20–32)
Calcium: 9.7 mg/dL (ref 8.6–10.3)
Creat: 1.1 mg/dL (ref 0.60–1.29)
Potassium: 3.9 mmol/L (ref 3.5–5.3)
Sodium: 138 mmol/L (ref 135–146)

## 2022-11-06 LAB — URINE CYTOLOGY ANCILLARY ONLY
Chlamydia: NEGATIVE
Comment: NEGATIVE
Comment: NORMAL
Neisseria Gonorrhea: NEGATIVE

## 2022-11-06 LAB — CYTOLOGY, (ORAL, ANAL, URETHRAL) ANCILLARY ONLY
Chlamydia: NEGATIVE
Chlamydia: NEGATIVE
Comment: NEGATIVE
Comment: NEGATIVE
Comment: NORMAL
Comment: NORMAL
Neisseria Gonorrhea: NEGATIVE
Neisseria Gonorrhea: NEGATIVE

## 2022-11-18 MED ORDER — CEFADROXIL 500 MG PO CAPS: 500.0000 mg | ORAL_CAPSULE | Freq: Two times a day (BID) | ORAL | 0 refills | Status: DC

## 2022-12-13 ENCOUNTER — Other Ambulatory Visit: Payer: BC Managed Care – PPO

## 2022-12-24 ENCOUNTER — Other Ambulatory Visit: Payer: Self-pay

## 2022-12-24 ENCOUNTER — Ambulatory Visit: Payer: BC Managed Care – PPO | Admitting: Physician Assistant

## 2022-12-24 ENCOUNTER — Ambulatory Visit (INDEPENDENT_AMBULATORY_CARE_PROVIDER_SITE_OTHER): Payer: BC Managed Care – PPO | Admitting: Pharmacist

## 2022-12-24 ENCOUNTER — Other Ambulatory Visit (HOSPITAL_COMMUNITY)
Admission: RE | Admit: 2022-12-24 | Discharge: 2022-12-24 | Disposition: A | Discharge status: Home / Self Care | Payer: BC Managed Care – PPO | Source: Ambulatory Visit | Attending: Physician Assistant | Admitting: Physician Assistant

## 2022-12-24 DIAGNOSIS — Z202 Contact with and (suspected) exposure to infections with a predominantly sexual mode of transmission: Secondary | ICD-10-CM | POA: Diagnosis not present

## 2022-12-24 DIAGNOSIS — Z113 Encounter for screening for infections with a predominantly sexual mode of transmission: Secondary | ICD-10-CM

## 2022-12-24 DIAGNOSIS — Z23 Encounter for immunization: Secondary | ICD-10-CM

## 2022-12-24 DIAGNOSIS — B20 Human immunodeficiency virus [HIV] disease: Secondary | ICD-10-CM | POA: Diagnosis not present

## 2022-12-24 MED ORDER — AZITHROMYCIN 250 MG PO TABS
1000.0000 mg | ORAL_TABLET | Freq: Once | ORAL | Status: AC
Start: 2022-12-24 — End: 2022-12-24
  Administered 2022-12-24: 1000 mg via ORAL

## 2022-12-24 MED ORDER — CEFTRIAXONE SODIUM 500 MG IJ SOLR
500.0000 mg | Freq: Once | INTRAMUSCULAR | Status: AC
Start: 2022-12-24 — End: 2022-12-24
  Administered 2022-12-24: 500 mg via INTRAMUSCULAR

## 2022-12-24 NOTE — Progress Notes (Signed)
   12/24/2022  HPI: Collin Thomas is a 42 y.o. male who presents to the [facility] clinic today for STI testing.  Patient Active Problem List   Diagnosis Date Noted   Healthcare maintenance 08/28/2022   Other fatigue 08/28/2022   Impetigo 06/21/2021   Vaccine counseling 05/17/2021   Routine screening for STI (sexually transmitted infection) 05/17/2021   Syphilis 05/17/2021   Encounter for medication monitoring 05/17/2021   Ear fullness 05/17/2021   HIV disease (HCC) 05/10/2021   High risk sexual behavior 05/10/2021    Patient's Medications  New Prescriptions   No medications on file  Previous Medications   BICTEGRAVIR-EMTRICITABINE-TENOFOVIR AF ([name]) 50-200-25 MG TABS TABLET    Take 1 tablet by mouth daily.   CEFADROXIL (DURICEF) 500 MG CAPSULE    Take 1 capsule (500 mg total) by mouth 2 (two) times daily.   CEFADROXIL (DURICEF) 500 MG CAPSULE    Take 1 capsule (500 mg total) by mouth 2 (two) times daily.   MUPIROCIN OINTMENT (BACTROBAN) 2 %    Apply 1 Application topically 2 (two) times daily.   TERBINAFINE (LAMISIL) 250 MG TABLET    Please take one a day x 7days, repeat every 4 weeks x 4 months   VALACYCLOVIR (VALTREX) 1000 MG TABLET    Take 1 tablet daily for 5 days during HSV outbreak as needed.  Modified Medications   No medications on file  Discontinued Medications   No medications on file    Assessment: Collin Thomas comes in today for STI testing. He has been experiencing painful urination and penile discharge for the last few days. He only has one partner and is the insertive partner during his sexual encounters. No other symptoms. Will treat for gonorrhea and chlamydia while he is here. He states that doxycycline makes him very nauseous so will give him azithromycin in clinic today. Also requesting a viral load and CD4 check. Reports adherence and no missed doses.  Also agrees to annual flu and COVID vaccines while he is here.   Administered ceftriaxone 500 mg in left  upper outer quadrant of the gluteal  muscle. He tolerated well. Advised him to abstain from sexual activity for 7 days and to tell his partner to get tested and treated. All questions answered.  Plan: - STI screening: RPR, urine/rectal/pharyngeal GC/CT swabs for cytology today - Ceftriaxone 500 mg IM x 1 - Azithromycin 1 gm PO x 1 - Annual flu and COVID vaccines administered - HIV RNA and CD4 today per patient request - F/u results to see if treatment is needed - F/u with Tammy Sours on 04/17/23  Collin Thomas, PharmD, BCIDP, AAHIVP, CPP Clinical Pharmacist Practitioner Infectious Diseases Clinical Pharmacist [facility] 12/24/2022, 10:44 AM

## 2022-12-25 ENCOUNTER — Encounter: Payer: Self-pay | Admitting: Pharmacist

## 2022-12-25 LAB — URINE CYTOLOGY ANCILLARY ONLY
Chlamydia: NEGATIVE
Comment: NEGATIVE
Comment: NORMAL
Neisseria Gonorrhea: POSITIVE — AB

## 2022-12-25 LAB — CYTOLOGY, (ORAL, ANAL, URETHRAL) ANCILLARY ONLY
Chlamydia: NEGATIVE
Chlamydia: NEGATIVE
Comment: NEGATIVE
Comment: NEGATIVE
Comment: NORMAL
Comment: NORMAL
Neisseria Gonorrhea: NEGATIVE
Neisseria Gonorrhea: POSITIVE — AB

## 2022-12-26 LAB — RPR: RPR Ser Ql: NONREACTIVE

## 2022-12-26 LAB — HIV-1 RNA QUANT-NO REFLEX-BLD
HIV 1 RNA Quant: NOT DETECTED {copies}/mL
HIV-1 RNA Quant, Log: NOT DETECTED {Log_copies}/mL

## 2022-12-26 LAB — T-HELPER CELLS (CD4) COUNT (NOT AT ARMC)
CD4 % Helper T Cell: 47 % (ref 33–65)
CD4 T Cell Abs: 842 /uL (ref 400–1790)

## 2023-01-03 ENCOUNTER — Ambulatory Visit (INDEPENDENT_AMBULATORY_CARE_PROVIDER_SITE_OTHER): Payer: BC Managed Care – PPO | Admitting: Podiatrist

## 2023-01-03 DIAGNOSIS — Z91199 Patient's noncompliance with other medical treatment and regimen due to unspecified reason: Secondary | ICD-10-CM

## 2023-01-03 NOTE — Progress Notes (Unsigned)
No Show

## 2023-01-30 ENCOUNTER — Ambulatory Visit: Payer: BC Managed Care – PPO | Admitting: Pharmacist

## 2023-02-11 ENCOUNTER — Ambulatory Visit: Payer: BC Managed Care – PPO | Admitting: Family

## 2023-02-14 ENCOUNTER — Ambulatory Visit (INDEPENDENT_AMBULATORY_CARE_PROVIDER_SITE_OTHER): Payer: BC Managed Care – PPO

## 2023-02-14 DIAGNOSIS — B351 Tinea unguium: Secondary | ICD-10-CM

## 2023-02-14 NOTE — Progress Notes (Signed)
Patient presents today for the 2nd laser treatment. Diagnosed with mycotic nail infection by Dr. Charlsie Merles.   All toenails affected, very mild.  Patient denies any visual improvement.  All other systems are negative.  Nails were filed thin. Laser therapy was administered to 1-5 toenails bilaterally and patient tolerated the treatment well. All safety precautions were in place.   Single laser pass was done on non-affected nails.   Follow up in 8 weeks for laser # 3.

## 2023-02-18 ENCOUNTER — Ambulatory Visit: Payer: BC Managed Care – PPO | Admitting: Family

## 2023-02-18 ENCOUNTER — Encounter: Payer: Self-pay | Admitting: Family

## 2023-02-18 ENCOUNTER — Other Ambulatory Visit: Payer: Self-pay

## 2023-02-18 ENCOUNTER — Other Ambulatory Visit (HOSPITAL_COMMUNITY)
Admission: RE | Admit: 2023-02-18 | Discharge: 2023-02-18 | Disposition: A | Discharge status: Home / Self Care | Payer: BC Managed Care – PPO | Source: Ambulatory Visit | Attending: Family | Admitting: Family

## 2023-02-18 VITALS — BP 120/76 | HR 56 | Temp 98.1°F | Wt 171.0 lb

## 2023-02-18 DIAGNOSIS — J028 Acute pharyngitis due to other specified organisms: Secondary | ICD-10-CM | POA: Diagnosis not present

## 2023-02-18 DIAGNOSIS — B9789 Other viral agents as the cause of diseases classified elsewhere: Secondary | ICD-10-CM | POA: Diagnosis not present

## 2023-02-18 DIAGNOSIS — R0989 Other specified symptoms and signs involving the circulatory and respiratory systems: Secondary | ICD-10-CM | POA: Insufficient documentation

## 2023-02-18 DIAGNOSIS — Z0000 Encounter for general adult medical examination without abnormal findings: Secondary | ICD-10-CM

## 2023-02-18 DIAGNOSIS — Z113 Encounter for screening for infections with a predominantly sexual mode of transmission: Secondary | ICD-10-CM | POA: Diagnosis not present

## 2023-02-18 DIAGNOSIS — Z23 Encounter for immunization: Secondary | ICD-10-CM

## 2023-02-18 HISTORY — DX: Other specified symptoms and signs involving the circulatory and respiratory systems: R09.89

## 2023-02-18 MED ORDER — HYDROCODONE BIT-HOMATROP MBR 5-1.5 MG/5ML PO SOLN: 5.0000 mL | Freq: Four times a day (QID) | ORAL | 0 refills | Status: DC | PRN

## 2023-02-18 MED ORDER — PREDNISONE 10 MG (21) PO TBPK: ORAL_TABLET | ORAL | 0 refills | Status: DC

## 2023-02-18 NOTE — Progress Notes (Signed)
Brief Narrative   Patient ID: Collin Thomas, male    DOB: 12-17-1980, 42 y.o.   MRN: [id number]  Collin Thomas is a 42 y/o male diagnosed with HIV disease in January 2023 with risk factor of MSM. Initial viral load was 6.8 million with CD4 count 610. Genotype with no significant medication resistant mutations. Entered care at [facility] Stage 1. No history of opportunistic infection. Sole ART regimen of [name].   Subjective:    Chief Complaint  Patient presents with   Follow-up    Sore throat/ persistent cough     HPI:  Collin Thomas is a 42 y.o. male with HIV disease last seen on 12/24/2022 with good adherence and tolerance to [place] and presenting today for an acute office visit.   Mr. Colberg has been doing okay since his last office visit and began having a cough and sore throat at least a couple of weeks ago that have not changed since initial presentation. Denies fevers or chills. Has had some congestion and rhinorrea. No sinus pain or pressure. Interventions have included cough drops and Nyquil which have temporarily helped with his symptoms. Requesting STD testing and any vaccinations due,.  Denies fevers, chills, night sweats, headaches, changes in vision, neck pain/stiffness, nausea, diarrhea, vomiting, lesions or rashes.  Lab Results  Component Value Date   CD4TCELL 47 12/24/2022   CD4TABS 842 12/24/2022   Lab Results  Component Value Date   HIV1RNAQUANT Not Detected 12/24/2022     Allergies  Allergen Reactions   Bee Pollen Cough    Hay fever symptoms       Outpatient Medications Prior to Visit  Medication Sig Dispense Refill   bictegravir-emtricitabine-tenofovir AF ([name]) 50-200-25 MG TABS tablet Take 1 tablet by mouth daily. 90 tablet 3   valACYclovir (VALTREX) 1000 MG tablet Take 1 tablet daily for 5 days during HSV outbreak as needed. 30 tablet 2   cefadroxil (DURICEF) 500 MG capsule Take 1 capsule (500 mg total) by mouth 2 (two) times daily. 14  capsule 0   cefadroxil (DURICEF) 500 MG capsule Take 1 capsule (500 mg total) by mouth 2 (two) times daily. 14 capsule 0   mupirocin ointment (BACTROBAN) 2 % Apply 1 Application topically 2 (two) times daily. 22 g 0   terbinafine (LAMISIL) 250 MG tablet Please take one a day x 7days, repeat every 4 weeks x 4 months 28 tablet 0   No facility-administered medications prior to visit.     Past Medical History:  Diagnosis Date   Gonorrhea    Herpes    HIV (human immunodeficiency virus infection) (HCC)    Syphilis      History reviewed. No pertinent surgical history.    Review of Systems  Constitutional:  Positive for fatigue. Negative for appetite change, chills, fever and unexpected weight change.  HENT:  Positive for congestion, rhinorrhea and sore throat. Negative for ear discharge, ear pain, postnasal drip, sinus pressure, sinus pain, sneezing, trouble swallowing and voice change.   Eyes:  Negative for visual disturbance.  Respiratory:  Positive for cough and wheezing. Negative for chest tightness and shortness of breath.   Cardiovascular:  Negative for chest pain and leg swelling.  Gastrointestinal:  Negative for abdominal pain, constipation, diarrhea, nausea and vomiting.  Genitourinary:  Negative for dysuria, flank pain, frequency, genital sores, hematuria and urgency.  Skin:  Negative for rash.  Allergic/Immunologic: Negative for immunocompromised state.  Neurological:  Negative for dizziness and headaches.  Objective:    BP 120/76   Pulse (!) 56   Temp 98.1 F (36.7 C) (Oral)   Wt 171 lb (77.6 kg)   SpO2 98%   BMI 21.37 kg/m  Nursing note and vital signs reviewed.  Physical Exam Constitutional:      General: He is not in acute distress.    Appearance: He is well-developed.  HENT:     Right Ear: Hearing, tympanic membrane, ear canal and external ear normal.     Left Ear: Hearing, tympanic membrane, ear canal and external ear normal.     Mouth/Throat:      Lips: Pink.     Mouth: Mucous membranes are moist. No oral lesions.     Pharynx: No pharyngeal swelling, oropharyngeal exudate, posterior oropharyngeal erythema or postnasal drip.  Cardiovascular:     Rate and Rhythm: Normal rate and regular rhythm.     Heart sounds: Normal heart sounds.  Pulmonary:     Effort: Pulmonary effort is normal.     Breath sounds: Wheezing present.  Lymphadenopathy:     Cervical: Cervical adenopathy (right sided) present.  Skin:    General: Skin is warm and dry.  Neurological:     Mental Status: He is alert and oriented to person, place, and time.  Psychiatric:        Mood and Affect: Mood normal.         11/04/2022   10:32 AM 04/22/2022    9:48 AM 05/10/2021    3:36 PM  Depression screen PHQ 2/9  Decreased Interest 0 0 3  Down, Depressed, Hopeless 0 0 3  PHQ - 2 Score 0 0 6  Altered sleeping   3  Tired, decreased energy   3  Change in appetite   2  Feeling bad or failure about yourself    0  Trouble concentrating   0  Moving slowly or fidgety/restless   0  Suicidal thoughts   0  PHQ-9 Score   14  Difficult doing work/chores   Not difficult at all       Assessment & Plan:    Patient Active Problem List   Diagnosis Date Noted   Viral sore throat 02/18/2023   Healthcare maintenance 08/28/2022   Other fatigue 08/28/2022   Impetigo 06/21/2021   Vaccine counseling 05/17/2021   Routine screening for STI (sexually transmitted infection) 05/17/2021   Syphilis 05/17/2021   Encounter for medication monitoring 05/17/2021   Ear fullness 05/17/2021   HIV disease (HCC) 05/10/2021   High risk sexual behavior 05/10/2021     Problem List Items Addressed This Visit       Respiratory   Viral sore throat - Primary    Mr. Gartman appears to have viral respiratory symptoms with some lymphadenopathy. No antibiotics currently indicated. Start prednisone dosepack and hycodan as needed for symptom relief and supportive care. STD check also in process or  wouldn't suspect this would be STD related. Follow up for any worsening or if symptoms do not improve.         Other   Healthcare maintenance    Discussed importance of safe sexual practice and condom use. Condoms and STD testing offered.  Vaccinations reviewed - HPV updated.       Other Visit Diagnoses     Screening for STDs (sexually transmitted diseases)       Relevant Orders   Urine cytology ancillary only   Cytology (oral, anal, urethral) ancillary only   Cytology (oral, anal, urethral)  ancillary only   RPR   HPV 9-valent vaccine,Recombinat (Completed)   Need for HPV vaccination       Relevant Orders   HPV 9-valent vaccine,Recombinat (Completed)        I have discontinued Wendy Poet. Murty's terbinafine, cefadroxil, mupirocin ointment, and cefadroxil. I am also having him start on predniSONE and HYDROcodone bit-homatropine. Additionally, I am having him maintain his [name] and valACYclovir.   Meds ordered this encounter  Medications   predniSONE (STERAPRED UNI-PAK 21 TAB) 10 MG (21) TBPK tablet    Sig: Take per package instructions    Dispense:  21 each    Refill:  0    Order Specific Question:   Supervising Provider    Answer:   Drue Second, CYNTHIA [[id number]]   HYDROcodone bit-homatropine (HYCODAN) 5-1.5 MG/5ML syrup    Sig: Take 5 mLs by mouth every 6 (six) hours as needed for cough.    Dispense:  120 mL    Refill:  0    Order Specific Question:   Supervising Provider    Answer:   Judyann Munson [[id number]]     Follow-up: As schedule of if symptoms worsen or do not improve.   Marcos Eke, MSN, FNP-C Nurse Practitioner [facility] for Infectious Disease [facility] Main number: [phone]

## 2023-02-18 NOTE — Assessment & Plan Note (Signed)
Discussed importance of safe sexual practice and condom use. Condoms and STD testing offered.  Vaccinations reviewed - HPV updated.

## 2023-02-18 NOTE — Patient Instructions (Signed)
Nice to see you.  Continue to take your medication daily as prescribed.  Medications have been sent to the pharmacy.  General Recommendations:   Please drink plenty of fluids. Get plenty of rest  Sleep in humidified air Use saline nasal sprays Netti pot   OTC Medications:  Decongestants - helps relieve congestion  Flonase (generic fluticasone) or Nasacort (generic triamcinolone) - please make sure to use the "cross-over" technique at a 45 degree angle towards the opposite eye as opposed to straight up the nasal passageway.  Sudafed (generic pseudoephedrine - Note this is the one that is available behind the pharmacy counter); Products with phenylephrine (-PE) may also be used but is often not as effective as pseudoephedrine.  If you have HIGH BLOOD PRESSURE - Coricidin HBP; AVOID any product that is -D as this contains pseudoephedrine which may increase your blood pressure. Afrin (oxymetazoline) every 6-8 hours for up to 3 days.   Allergies - helps relieve runny nose, itchy eyes and sneezing  Claritin (generic loratidine), Allegra (fexofenidine), or Zyrtec (generic cyrterizine) for runny nose. These medications should not cause drowsiness. Note - Benadryl (generic diphenhydramine) may be used however may cause drowsiness  Cough -  Delsym or Robitussin (generic dextromethorphan)  Expectorants - helps loosen mucus to ease removal  Mucinex (generic guaifenesin) as directed on the package.  Headaches / General Aches  Tylenol (generic acetaminophen) - DO NOT EXCEED 3 grams (3,000 mg) in a 24 hour time period Advil/Motrin (generic ibuprofen)   Sore Throat -  Salt water gargle  Chloraseptic (generic benzocaine) spray or lozenges / Sucrets (generic dyclonine)     Have a great day and stay safe!

## 2023-02-18 NOTE — Assessment & Plan Note (Signed)
Mr. Olveda appears to have viral respiratory symptoms with some lymphadenopathy. No antibiotics currently indicated. Start prednisone dosepack and hycodan as needed for symptom relief and supportive care. STD check also in process or wouldn't suspect this would be STD related. Follow up for any worsening or if symptoms do not improve.

## 2023-02-19 LAB — CYTOLOGY, (ORAL, ANAL, URETHRAL) ANCILLARY ONLY
Chlamydia: NEGATIVE
Chlamydia: NEGATIVE
Comment: NEGATIVE
Comment: NEGATIVE
Comment: NORMAL
Comment: NORMAL
Neisseria Gonorrhea: NEGATIVE
Neisseria Gonorrhea: NEGATIVE

## 2023-02-19 LAB — URINE CYTOLOGY ANCILLARY ONLY
Chlamydia: NEGATIVE
Comment: NEGATIVE
Comment: NORMAL
Neisseria Gonorrhea: NEGATIVE

## 2023-02-19 LAB — RPR: RPR Ser Ql: NONREACTIVE

## 2023-03-10 ENCOUNTER — Ambulatory Visit: Payer: BC Managed Care – PPO | Admitting: Family

## 2023-04-03 DIAGNOSIS — S0502XA Injury of conjunctiva and corneal abrasion without foreign body, left eye, initial encounter: Secondary | ICD-10-CM | POA: Diagnosis not present

## 2023-04-03 DIAGNOSIS — S0501XA Injury of conjunctiva and corneal abrasion without foreign body, right eye, initial encounter: Secondary | ICD-10-CM | POA: Diagnosis not present

## 2023-04-11 DIAGNOSIS — Z6820 Body mass index (BMI) 20.0-20.9, adult: Secondary | ICD-10-CM | POA: Diagnosis not present

## 2023-04-11 DIAGNOSIS — Z0000 Encounter for general adult medical examination without abnormal findings: Secondary | ICD-10-CM | POA: Diagnosis not present

## 2023-04-11 DIAGNOSIS — Z114 Encounter for screening for human immunodeficiency virus [HIV]: Secondary | ICD-10-CM | POA: Diagnosis not present

## 2023-04-11 DIAGNOSIS — E559 Vitamin D deficiency, unspecified: Secondary | ICD-10-CM | POA: Diagnosis not present

## 2023-04-11 DIAGNOSIS — Z131 Encounter for screening for diabetes mellitus: Secondary | ICD-10-CM | POA: Diagnosis not present

## 2023-04-11 DIAGNOSIS — R059 Cough, unspecified: Secondary | ICD-10-CM | POA: Diagnosis not present

## 2023-04-11 DIAGNOSIS — Z13220 Encounter for screening for lipoid disorders: Secondary | ICD-10-CM | POA: Diagnosis not present

## 2023-04-11 DIAGNOSIS — R070 Pain in throat: Secondary | ICD-10-CM | POA: Diagnosis not present

## 2023-04-11 DIAGNOSIS — Z7251 High risk heterosexual behavior: Secondary | ICD-10-CM | POA: Diagnosis not present

## 2023-04-11 DIAGNOSIS — L600 Ingrowing nail: Secondary | ICD-10-CM | POA: Diagnosis not present

## 2023-04-17 ENCOUNTER — Ambulatory Visit (INDEPENDENT_AMBULATORY_CARE_PROVIDER_SITE_OTHER): Payer: BC Managed Care – PPO | Admitting: Family

## 2023-04-17 ENCOUNTER — Encounter: Payer: Self-pay | Admitting: Family

## 2023-04-17 ENCOUNTER — Other Ambulatory Visit: Payer: Self-pay

## 2023-04-17 ENCOUNTER — Other Ambulatory Visit (HOSPITAL_COMMUNITY)
Admission: RE | Admit: 2023-04-17 | Discharge: 2023-04-17 | Disposition: A | Discharge status: Home / Self Care | Payer: BC Managed Care – PPO | Source: Ambulatory Visit | Attending: Family | Admitting: Family

## 2023-04-17 VITALS — BP 129/85 | HR 72 | Temp 97.4°F | Resp 16 | Ht 75.0 in | Wt 164.6 lb

## 2023-04-17 DIAGNOSIS — Z7252 High risk homosexual behavior: Secondary | ICD-10-CM | POA: Diagnosis not present

## 2023-04-17 DIAGNOSIS — F17210 Nicotine dependence, cigarettes, uncomplicated: Secondary | ICD-10-CM

## 2023-04-17 DIAGNOSIS — Z1212 Encounter for screening for malignant neoplasm of rectum: Secondary | ICD-10-CM | POA: Diagnosis not present

## 2023-04-17 DIAGNOSIS — B009 Herpesviral infection, unspecified: Secondary | ICD-10-CM

## 2023-04-17 DIAGNOSIS — Z0000 Encounter for general adult medical examination without abnormal findings: Secondary | ICD-10-CM

## 2023-04-17 DIAGNOSIS — Z1151 Encounter for screening for human papillomavirus (HPV): Secondary | ICD-10-CM | POA: Insufficient documentation

## 2023-04-17 DIAGNOSIS — Z113 Encounter for screening for infections with a predominantly sexual mode of transmission: Secondary | ICD-10-CM | POA: Diagnosis not present

## 2023-04-17 DIAGNOSIS — R0989 Other specified symptoms and signs involving the circulatory and respiratory systems: Secondary | ICD-10-CM

## 2023-04-17 DIAGNOSIS — F4320 Adjustment disorder, unspecified: Secondary | ICD-10-CM

## 2023-04-17 DIAGNOSIS — B20 Human immunodeficiency virus [HIV] disease: Secondary | ICD-10-CM | POA: Diagnosis not present

## 2023-04-17 DIAGNOSIS — F4321 Adjustment disorder with depressed mood: Secondary | ICD-10-CM

## 2023-04-17 DIAGNOSIS — Z720 Tobacco use: Secondary | ICD-10-CM

## 2023-04-17 HISTORY — DX: Adjustment disorder, unspecified: F43.20

## 2023-04-17 HISTORY — DX: Nicotine dependence, cigarettes, uncomplicated: F17.210

## 2023-04-17 MED ORDER — VALACYCLOVIR HCL 1 G PO TABS
ORAL_TABLET | ORAL | 2 refills | Status: AC
Start: 2023-04-17 — End: ?

## 2023-04-17 MED ORDER — BIKTARVY 50-200-25 MG PO TABS: 1.0000 | ORAL_TABLET | Freq: Every day | ORAL | 3 refills | Status: DC

## 2023-04-17 MED ORDER — DOXYCYCLINE HYCLATE 100 MG PO TABS: ORAL_TABLET | ORAL | 0 refills | Status: DC

## 2023-04-17 MED ORDER — PREDNISONE 10 MG (21) PO TBPK: ORAL_TABLET | ORAL | 0 refills | Status: DC

## 2023-04-17 NOTE — Patient Instructions (Addendum)
 Nice to see you. ? ?We will check your lab work today. ? ?Continue to take your medication daily as prescribed. ? ?Refills have been sent to the pharmacy. ? ?Plan for follow up in 6 months or sooner if needed with lab work on the same day. ? ?Have a great day and stay safe! ? ?

## 2023-04-17 NOTE — Assessment & Plan Note (Signed)
 Having continued grief with HIV diagnosis and agree with consideration for counseling to help with coping strategies. No indications for medications currently.

## 2023-04-17 NOTE — Assessment & Plan Note (Signed)
 Discussed role of tobacco as a contributing factor to current symptoms with encouragement for tobacco cessation to reduce risk of disease and complications in the future. Not ready to quit smoking at this time.

## 2023-04-17 NOTE — Progress Notes (Signed)
 Brief Narrative   Patient ID: Collin Thomas, male    DOB: [birth year], 43 y.o.   MRN: [id number]  Collin Thomas is a 43 y/o male diagnosed with HIV disease in January 2023 with risk factor of MSM. Initial viral load was 6.8 million with CD4 count 610. Genotype with no significant medication resistant mutations. Entered care at [facility] Stage 1. No history of opportunistic infection. Sole ART regimen of [name] .   Subjective:    Chief Complaint  Patient presents with   Follow-up    B20 6 month - patient reports still having a sore throat     HPI:  Collin Thomas is a 43 y.o. male with HIV disease last seen on 12/24/22 by Charlott Flowers, PharmD, CPP on 12/24/22 with well controlled virus and good adherence and tolerance to [name] . Viral load was undetectable and CD4 count 842. STD testing was negative. Seen in the interim for symptoms of viral sore throat. Previous renal function and electrolytes within normal ranges. Here today for routine follow up.    Collin Thomas has been doing well since his last office visit and continues to take [name]  as prescribed with no adverse side effects or problems obtaining medication from the pharmacy.  Has concerns about potential exposure to sexually transmitted infections (STIs) due to a new sexual relationship. He expresses a desire to avoid a repeat of past experiences where he felt embarrassed and concerned for his health. He has been informed about Doxycycline  post-exposure prophylaxis (Doxy-PEP) by a friend and is interested in this as a potential option for STI prevention. He acknowledges the need for continued condom use and understands that Doxy-PEP is not a complete solution but sees it as a potential additional layer of protection.  Also has persistent respiratory symptoms, despite a recent course of Prednisone . He describes a sensation of pressure in the chest and has noticed swelling in the neck. He reports that the Prednisone  provided  significant relief but did not completely resolve his symptoms.Mentions fatigue, which he attributes to a recent heavy workload.  Dealing with the psychological impact of living with HIV. Expresses feelings of grief and is considering seeking therapy to help manage these emotions. Concerns about his smoking habits, acknowledging that this could be contributing to his respiratory symptoms and overall health.  Condoms and site specific STD testing offered. Healthcare maintenance reviewed.   Denies fevers, chills, night sweats, headaches, changes in vision, neck pain/stiffness, nausea, diarrhea, vomiting, lesions or rashes.  Lab Results  Component Value Date   CD4TCELL 47 12/24/2022   CD4TABS 842 12/24/2022   Lab Results  Component Value Date   HIV1RNAQUANT Not Detected 12/24/2022     Allergies  Allergen Reactions   Bee Pollen Cough    Hay fever symptoms       Outpatient Medications Prior to Visit  Medication Sig Dispense Refill   triamcinolone cream (KENALOG) 0.1 % Apply topically 2 (two) times daily.     bictegravir-emtricitabine -tenofovir  AF ([name] ) 50-200-25 MG TABS tablet Take 1 tablet by mouth daily. 90 tablet 3   valACYclovir  (VALTREX ) 1000 MG tablet Take 1 tablet daily for 5 days during HSV outbreak as needed. 30 tablet 2   HYDROcodone  bit-homatropine (HYCODAN) 5-1.5 MG/5ML syrup Take 5 mLs by mouth every 6 (six) hours as needed for cough. (Patient not taking: Reported on 04/17/2023) 120 mL 0   predniSONE  (STERAPRED UNI-PAK 21 TAB) 10 MG (21) TBPK tablet Take per package instructions (Patient not taking: Reported on 04/17/2023) 21 each  0   No facility-administered medications prior to visit.     Past Medical History:  Diagnosis Date   Gonorrhea    Herpes    HIV (human immunodeficiency virus infection) (HCC)    Syphilis      History reviewed. No pertinent surgical history.    Review of Systems  Constitutional:  Negative for appetite change, chills, fatigue,  fever and unexpected weight change.  Eyes:  Negative for visual disturbance.  Respiratory:  Negative for cough, chest tightness, shortness of breath and wheezing.   Cardiovascular:  Negative for chest pain and leg swelling.  Gastrointestinal:  Negative for abdominal pain, constipation, diarrhea, nausea and vomiting.  Genitourinary:  Negative for dysuria, flank pain, frequency, genital sores, hematuria and urgency.  Skin:  Negative for rash.  Allergic/Immunologic: Negative for immunocompromised state.  Neurological:  Negative for dizziness and headaches.      Objective:    BP 129/85   Pulse 72   Temp (!) 97.4 F (36.3 C) (Temporal)   Resp 16   Ht 6' 3 (1.905 m)   Wt 164 lb 9.6 oz (74.7 kg)   SpO2 98%   BMI 20.57 kg/m  Nursing note and vital signs reviewed.  Physical Exam Constitutional:      General: He is not in acute distress.    Appearance: He is well-developed.  Eyes:     Conjunctiva/sclera: Conjunctivae normal.  Cardiovascular:     Rate and Rhythm: Normal rate and regular rhythm.     Heart sounds: Normal heart sounds. No murmur heard.    No friction rub. No gallop.  Pulmonary:     Effort: Pulmonary effort is normal. No respiratory distress.     Breath sounds: Normal breath sounds. No wheezing or rales.  Chest:     Chest wall: No tenderness.  Abdominal:     General: Bowel sounds are normal.     Palpations: Abdomen is soft.     Tenderness: There is no abdominal tenderness.  Musculoskeletal:     Cervical back: Neck supple.  Lymphadenopathy:     Cervical: No cervical adenopathy.  Skin:    General: Skin is warm and dry.     Findings: No rash.  Neurological:     Mental Status: He is alert and oriented to person, place, and time.  Psychiatric:        Behavior: Behavior normal.        Thought Content: Thought content normal.        Judgment: Judgment normal.         04/17/2023    9:10 AM 11/04/2022   10:32 AM 04/22/2022    9:48 AM 05/10/2021    3:36 PM   Depression screen PHQ 2/9  Decreased Interest 0 0 0 3  Down, Depressed, Hopeless 0 0 0 3  PHQ - 2 Score 0 0 0 6  Altered sleeping    3  Tired, decreased energy    3  Change in appetite    2  Feeling bad or failure about yourself     0  Trouble concentrating    0  Moving slowly or fidgety/restless    0  Suicidal thoughts    0  PHQ-9 Score    14  Difficult doing work/chores    Not difficult at all       Assessment & Plan:    Patient Active Problem List   Diagnosis Date Noted   Tobacco use 04/17/2023   Grief 04/17/2023   Respiratory  symptoms 02/18/2023   Healthcare maintenance 08/28/2022   Other fatigue 08/28/2022   Impetigo 06/21/2021   Vaccine counseling 05/17/2021   Routine screening for STI (sexually transmitted infection) 05/17/2021   Syphilis 05/17/2021   Encounter for medication monitoring 05/17/2021   Ear fullness 05/17/2021   HIV disease (HCC) 05/10/2021   High risk sexual behavior 05/10/2021     Problem List Items Addressed This Visit       Other   HIV disease (HCC) - Primary   Collin Thomas continues to have well controlled virus with good adherence and tolerance to [name] .  Reviewed lab work and discussed plan of care, U equals U, and family planning. Updated on HIV cure research. Check lab work. Continue current dose of [name] . Plan for follow up in  6 months or sooner if needed with lab work on the same day.       Relevant Medications   bictegravir-emtricitabine -tenofovir  AF ([name] ) 50-200-25 MG TABS tablet   valACYclovir  (VALTREX ) 1000 MG tablet   Other Relevant Orders   COMPLETE METABOLIC PANEL WITH GFR   HIV-1 RNA quant-no reflex-bld   CBC   T-helper cells (CD4) count (not at [facility])   High risk sexual behavior   Provide a prescription for Doxycycline  100mg  (10 tablets) for use as needed within 72 hours of high-risk sexual encounters.      Healthcare maintenance   Discussed importance of safe sexual practice and condom use. Condoms and  site specific STD testing offered.  Vaccinations reviewed and up to date.  Due for routine dental care which he will schedule independently.  Anal cancer screening collected.       Respiratory symptoms   Despite a prior course of Prednisone , symptoms persist with no acute distress noted on examination. No other clinical sign of infection. Prescribe another course of Prednisone  and advise him to reduce smoking.      Tobacco use   Discussed role of tobacco as a contributing factor to current symptoms with encouragement for tobacco cessation to reduce risk of disease and complications in the future. Not ready to quit smoking at this time.       Grief   Having continued grief with HIV diagnosis and agree with consideration for counseling to help with coping strategies. No indications for medications currently.       Other Visit Diagnoses       Screening for STDs (sexually transmitted diseases)       Relevant Orders   RPR   CT/NG RNA, TMA Rectal   GC/CT Probe, Amp (Throat)   C. trachomatis/N. gonorrhoeae RNA     Screening for rectal cancer       Relevant Orders   Cytology - PAP     HSV (herpes simplex virus) infection       Relevant Medications   bictegravir-emtricitabine -tenofovir  AF ([name] ) 50-200-25 MG TABS tablet   valACYclovir  (VALTREX ) 1000 MG tablet        I have discontinued Donnice BRAVO. Arrazola's HYDROcodone  bit-homatropine. I am also having him start on doxycycline . Additionally, I am having him maintain his triamcinolone cream, [name] , predniSONE , and valACYclovir .   Meds ordered this encounter  Medications   bictegravir-emtricitabine -tenofovir  AF ([name] ) 50-200-25 MG TABS tablet    Sig: Take 1 tablet by mouth daily.    Dispense:  90 tablet    Refill:  3    Supervising Provider:   LUIZ CHANNEL [phone]    Prescription Type::   Renewal   doxycycline  (VIBRA -TABS) 100 MG tablet  Sig: Take 2 tablets by mouth as needed within 72 hours of sexual contact.     Dispense:  10 tablet    Refill:  0    Supervising Provider:   SNIDER, CYNTHIA [[id number]]   predniSONE  (STERAPRED UNI-PAK 21 TAB) 10 MG (21) TBPK tablet    Sig: Take per package instructions    Dispense:  21 each    Refill:  0    Supervising Provider:   SNIDER, CYNTHIA [[id number]]   valACYclovir  (VALTREX ) 1000 MG tablet    Sig: Take 1 tablet daily for 5 days during HSV outbreak as needed.    Dispense:  30 tablet    Refill:  2    Supervising Provider:   LUIZ CHANNEL [[id number]]     Follow-up: Return in about 6 months (around 10/15/2023), or if symptoms worsen or fail to improve. or sooner if needed.    Cathlyn July, MSN, FNP-C Nurse Practitioner [facility] for Infectious Disease [facility] Main number: [phone]

## 2023-04-17 NOTE — Assessment & Plan Note (Signed)
 Provide a prescription for Doxycycline 100mg  (10 tablets) for use as needed within 72 hours of high-risk sexual encounters.

## 2023-04-17 NOTE — Assessment & Plan Note (Signed)
 Mr. Ashmead continues to have well controlled virus with good adherence and tolerance to [name] .  Reviewed lab work and discussed plan of care, U equals U, and family planning. Updated on HIV cure research. Check lab work. Continue current dose of [name] . Plan for follow up in  6 months or sooner if needed with lab work on the same day.

## 2023-04-17 NOTE — Assessment & Plan Note (Addendum)
 Discussed importance of safe sexual practice and condom use. Condoms and site specific STD testing offered.  Vaccinations reviewed and up to date.  Due for routine dental care which he will schedule independently.  Anal cancer screening collected.

## 2023-04-17 NOTE — Assessment & Plan Note (Signed)
 Despite a prior course of Prednisone, symptoms persist with no acute distress noted on examination. No other clinical sign of infection. Prescribe another course of Prednisone and advise him to reduce smoking.

## 2023-04-18 ENCOUNTER — Ambulatory Visit (INDEPENDENT_AMBULATORY_CARE_PROVIDER_SITE_OTHER): Payer: BC Managed Care – PPO | Admitting: *Deleted

## 2023-04-18 ENCOUNTER — Telehealth: Payer: Self-pay

## 2023-04-18 DIAGNOSIS — B351 Tinea unguium: Secondary | ICD-10-CM

## 2023-04-18 NOTE — Progress Notes (Signed)
 Patient presents today for the 4th laser treatment. Diagnosed with mycotic nail infection by Dr. Magdalen.   All toenails affected, very mild. Patient is satisfied with his results  Patient denies any visual improvement.  All other systems are negative.  Nails were filed thin. Laser therapy was administered to 1-5 toenails bilaterally and patient tolerated the treatment well. All safety precautions were in place.    Patient has completed the recommended laser treatments. He will follow up with Dr. Magdalen if needed

## 2023-04-18 NOTE — Telephone Encounter (Signed)
 Patient called concerned about CMP results. BG was at 61. Patient stated that he couldn't remember if he ate or drink anything prior to appointment yesterday. Advised I would send message back to provider to review.    Deshea Pooley Lesli Albee, CMA

## 2023-04-19 LAB — GC/CHLAMYDIA PROBE, AMP (THROAT)
Chlamydia trachomatis RNA: NOT DETECTED
Neisseria gonorrhoeae RNA: NOT DETECTED

## 2023-04-19 LAB — COMPLETE METABOLIC PANEL WITH GFR
AG Ratio: 1.6 (calc) (ref 1.0–2.5)
ALT: 14 U/L (ref 9–46)
AST: 19 U/L (ref 10–40)
Albumin: 5.1 g/dL (ref 3.6–5.1)
Alkaline phosphatase (APISO): 57 U/L (ref 36–130)
BUN/Creatinine Ratio: 10 (calc) (ref 6–22)
BUN: 13 mg/dL (ref 7–25)
CO2: 26 mmol/L (ref 20–32)
Calcium: 10.2 mg/dL (ref 8.6–10.3)
Chloride: 103 mmol/L (ref 98–110)
Creat: 1.33 mg/dL — ABNORMAL HIGH (ref 0.60–1.29)
Globulin: 3.2 g/dL (ref 1.9–3.7)
Glucose, Bld: 61 mg/dL — ABNORMAL LOW (ref 65–99)
Potassium: 4.3 mmol/L (ref 3.5–5.3)
Sodium: 140 mmol/L (ref 135–146)
Total Bilirubin: 0.6 mg/dL (ref 0.2–1.2)
Total Protein: 8.3 g/dL — ABNORMAL HIGH (ref 6.1–8.1)
eGFR: 69 mL/min/{1.73_m2} (ref >=60)

## 2023-04-19 LAB — CBC
HCT: 45.1 % (ref 38.5–50.0)
Hemoglobin: 15.7 g/dL (ref 13.2–17.1)
MCH: 32.9 pg (ref 27.0–33.0)
MCHC: 34.8 g/dL (ref 32.0–36.0)
MCV: 94.5 fL (ref 80.0–100.0)
MPV: 9.7 fL (ref 7.5–12.5)
Platelets: 326 10*3/uL (ref 140–400)
RBC: 4.77 10*6/uL (ref 4.20–5.80)
RDW: 12.3 % (ref 11.0–15.0)
WBC: 5 10*3/uL (ref 3.8–10.8)

## 2023-04-19 LAB — T-HELPER CELLS (CD4) COUNT (NOT AT ARMC)
Absolute CD4: 724 {cells}/uL (ref 490–1740)
CD4 T Helper %: 42 % (ref 30–61)
Total lymphocyte count: 1718 {cells}/uL (ref 850–3900)

## 2023-04-19 LAB — C. TRACHOMATIS/N. GONORRHOEAE RNA
C. trachomatis RNA, TMA: NOT DETECTED
N. gonorrhoeae RNA, TMA: NOT DETECTED

## 2023-04-19 LAB — RPR: RPR Ser Ql: NONREACTIVE

## 2023-04-19 LAB — CT/NG RNA, TMA RECTAL
Chlamydia Trachomatis RNA: NOT DETECTED
Neisseria Gonorrhoeae RNA: NOT DETECTED

## 2023-04-19 LAB — HIV-1 RNA QUANT-NO REFLEX-BLD
HIV 1 RNA Quant: NOT DETECTED {copies}/mL
HIV-1 RNA Quant, Log: NOT DETECTED {Log_copies}/mL

## 2023-04-23 NOTE — Progress Notes (Signed)
 Collin Thomas, male    DOB: Nov 24, 1980   MRN: [id number]   Brief patient profile:  56  male from [place]  active smoker h/o spring > fall nasal congestion rarely req otc's referred to pulmonary clinic 04/24/2023 by Dr Tobie  for new onset doe x Dec 2024.       Has cat x 2021 and  living in same dwelling x 2017     History of Present Illness  04/24/2023  Pulmonary/ 1st office eval/Collin Thomas  Chief Complaint  Patient presents with   Consult    Wheezing with non productive cough x 2 months.  Dyspnea:  MMRC1 = can walk nl pace, flat grade, can't hurry or go uphills or steps s sob   Cough: increased x one month dry assoc nasal congestion Sleep: bed is flat / 3 pillows baseline  SABA use: none  02 ldz:wnwz     No obvious day to day or daytime pattern/variability or assoc excess/ purulent sputum or mucus plugs or hemoptysis or cp or chest tightness,  or overt   hb symptoms.    Also denies any obvious fluctuation of symptoms with weather or environmental changes or other aggravating or alleviating factors except as outlined above   No unusual exposure hx or h/o childhood pna/ asthma or knowledge of premature birth.  Current Allergies, Complete Past Medical History, Past Surgical History, Family History, and Social History were reviewed in [name] record.  ROS  The following are not active complaints unless bolded Hoarseness, sore throat, dysphagia, dental problems, itching, sneezing,  nasal congestion or discharge of excess mucus or purulent secretions, ear ache,   fever, chills, sweats, unintended wt loss or wt gain, classically pleuritic or exertional cp,  orthopnea pnd or arm/hand swelling  or leg swelling, presyncope, palpitations, abdominal pain, anorexia, nausea, vomiting, diarrhea  or change in bowel habits or change in bladder habits, change in stools or change in urine, dysuria, hematuria,  rash, arthralgias, visual complaints, headache, numbness, weakness or  ataxia or problems with walking or coordination,  change in mood or  memory.             Outpatient Medications Prior to Visit  Medication Sig Dispense Refill   bictegravir-emtricitabine -tenofovir  AF ([name] ) 50-200-25 MG TABS tablet Take 1 tablet by mouth daily. 90 tablet 3   valACYclovir  (VALTREX ) 1000 MG tablet Take 1 tablet daily for 5 days during HSV outbreak as needed. 30 tablet 2   doxycycline  (VIBRA -TABS) 100 MG tablet Take 2 tablets by mouth as needed within 72 hours of sexual contact. (Patient not taking: Reported on 04/24/2023) 10 tablet 0   predniSONE  (STERAPRED UNI-PAK 21 TAB) 10 MG (21) TBPK tablet Never took it   0   triamcinolone cream (KENALOG) 0.1 % Apply topically 2 (two) times daily. (Patient not taking: Reported on 04/24/2023)     No facility-administered medications prior to visit.    Past Medical History:  Diagnosis Date   Gonorrhea    Herpes    HIV (human immunodeficiency virus infection) (HCC)    Syphilis       Objective:     BP 104/62 (BP Location: Left Arm, Patient Position: Sitting, Cuff Size: Normal)   Pulse (!) 53   Temp 99.2 F (37.3 C) (Oral)   Ht 6' 3 (1.905 m)   Wt 172 lb 9.6 oz (78.3 kg)   SpO2 100%   BMI 21.57 kg/m   SpO2: 100 % RA   Amb wm nad  HEENT : Oropharynx  clear/ no thrush      Nasal turbinates nl    NECK :  without  apparent JVD/ palpable Nodes/TM    LUNGS: no acc muscle use,  Nl contour chest which is clear to A and P bilaterally without cough on insp or exp maneuvers   CV:  RRR  no s3 or murmur or increase in P2, and no edema   ABD:  soft and nontender   MS:  Gait nl   ext warm without deformities Or obvious joint restrictions  calf tenderness, cyanosis or clubbing    SKIN: warm and dry without lesions    NEURO:  alert, approp, nl sensorium with  no motor or cerebellar deficits apparent.    Cxr at [name] Dec 2024  not called back yet     Assessment   Asthmatic bronchitis , chronic (HCC) Active  smoker - Onset Nov 2024 on background of smoking cigs/ allergic rhinitis with cat in house since 2021  - Airsupra  trial 04/24/2023  - PFT's 04/24/2023 >>>   Due to HIV I would like to see the pt observe the rule of 2 rules for saba which will limit chronic exp to ICS but have available higher doses for any flare rx pending return for pfts    Discussed in detail all the  indications, usual  risks and alternatives  relative to the benefits with patient who agrees to proceed with Rx as outlined.       - The proper method of use, as well as anticipated side effects, of a metered-dose inhaler were discussed and demonstrated to the patient using teach back method.        Each maintenance medication was reviewed in detail including emphasizing most importantly the difference between maintenance and prns and under what circumstances the prns are to be triggered using an action plan format where appropriate.  Total time for H and P, chart review, counseling, reviewing hfa  device(s) and generating customized AVS unique to this office visit / same day charting = 35 min           Collin America, MD 04/24/2023

## 2023-04-24 ENCOUNTER — Encounter: Payer: Self-pay | Admitting: Internal Medicine

## 2023-04-24 ENCOUNTER — Ambulatory Visit: Payer: BC Managed Care – PPO | Admitting: Internal Medicine

## 2023-04-24 VITALS — BP 104/62 | HR 53 | Temp 99.2°F | Ht 75.0 in | Wt 172.6 lb

## 2023-04-24 DIAGNOSIS — M79642 Pain in left hand: Secondary | ICD-10-CM | POA: Diagnosis not present

## 2023-04-24 DIAGNOSIS — J4489 Other specified chronic obstructive pulmonary disease: Secondary | ICD-10-CM

## 2023-04-24 DIAGNOSIS — L608 Other nail disorders: Secondary | ICD-10-CM | POA: Diagnosis not present

## 2023-04-24 DIAGNOSIS — F17210 Nicotine dependence, cigarettes, uncomplicated: Secondary | ICD-10-CM | POA: Diagnosis not present

## 2023-04-24 HISTORY — DX: Other specified chronic obstructive pulmonary disease: J44.89

## 2023-04-24 LAB — CYTOLOGY - PAP
Comment: NEGATIVE
Diagnosis: UNDETERMINED — AB
High risk HPV: NEGATIVE

## 2023-04-24 MED ORDER — AIRSUPRA 90-80 MCG/ACT IN AERO: 2.0000 | INHALATION_SPRAY | RESPIRATORY_TRACT | 11 refills | Status: AC | PRN

## 2023-04-24 NOTE — Assessment & Plan Note (Addendum)
 Active smoker - Onset Nov 2024 on background of smoking cigs/ allergic rhinitis with cat in house since 2021  - Airsupra  trial 04/24/2023  - PFT's 04/24/2023 >>>   Due to HIV I would like to see the pt observe the rule of 2 rules for saba which will limit chronic exp to ICS but have available higher doses for any flare rx pending return for pfts    Discussed in detail all the  indications, usual  risks and alternatives  relative to the benefits with patient who agrees to proceed with Rx as outlined.       - The proper method of use, as well as anticipated side effects, of a metered-dose inhaler were discussed and demonstrated to the patient using teach back method.        Each maintenance medication was reviewed in detail including emphasizing most importantly the difference between maintenance and prns and under what circumstances the prns are to be triggered using an action plan format where appropriate.  Total time for H and P, chart review, counseling, reviewing hfa  device(s) and generating customized AVS unique to this office visit / same day charting = 35 min

## 2023-04-24 NOTE — Patient Instructions (Signed)
 Mild asthmatic bronchitis   Cough / wheeze > Air supra  up to 2 pffs every 4 hours but goal is less than or equal 2 very 12 hours    The key is to stop smoking completely before smoking completely stops you!  My office will be contacting you by phone for referral for PFTS - if you don't hear back from my office within one week please call us  back or notify us  thru MyChart and we'll address it right away.     Please schedule a follow up visit in 3 months but call sooner if needed

## 2023-04-29 DIAGNOSIS — L608 Other nail disorders: Secondary | ICD-10-CM | POA: Diagnosis not present

## 2023-04-29 DIAGNOSIS — B351 Tinea unguium: Secondary | ICD-10-CM | POA: Diagnosis not present

## 2023-04-29 HISTORY — DX: Other nail disorders: L60.8

## 2023-05-07 ENCOUNTER — Institutional Professional Consult (permissible substitution): Payer: BC Managed Care – PPO | Admitting: Internal Medicine

## 2023-05-15 DIAGNOSIS — L608 Other nail disorders: Secondary | ICD-10-CM | POA: Diagnosis not present

## 2023-05-15 DIAGNOSIS — B351 Tinea unguium: Secondary | ICD-10-CM | POA: Diagnosis not present

## 2023-05-16 DIAGNOSIS — E559 Vitamin D deficiency, unspecified: Secondary | ICD-10-CM | POA: Diagnosis not present

## 2023-05-16 DIAGNOSIS — R030 Elevated blood-pressure reading, without diagnosis of hypertension: Secondary | ICD-10-CM | POA: Diagnosis not present

## 2023-05-16 DIAGNOSIS — R110 Nausea: Secondary | ICD-10-CM | POA: Diagnosis not present

## 2023-05-16 DIAGNOSIS — Z6820 Body mass index (BMI) 20.0-20.9, adult: Secondary | ICD-10-CM | POA: Diagnosis not present

## 2023-05-18 ENCOUNTER — Other Ambulatory Visit: Payer: Self-pay

## 2023-05-18 ENCOUNTER — Emergency Department (HOSPITAL_BASED_OUTPATIENT_CLINIC_OR_DEPARTMENT_OTHER)
Admission: EM | Admit: 2023-05-18 | Discharge: 2023-05-18 | Disposition: A | Discharge status: Home / Self Care | Payer: BC Managed Care – PPO | Attending: Emergency Medicine | Admitting: Emergency Medicine

## 2023-05-18 ENCOUNTER — Encounter (HOSPITAL_BASED_OUTPATIENT_CLINIC_OR_DEPARTMENT_OTHER): Payer: Self-pay | Admitting: Emergency Medicine

## 2023-05-18 DIAGNOSIS — K219 Gastro-esophageal reflux disease without esophagitis: Secondary | ICD-10-CM | POA: Insufficient documentation

## 2023-05-18 DIAGNOSIS — R12 Heartburn: Secondary | ICD-10-CM | POA: Diagnosis not present

## 2023-05-18 DIAGNOSIS — Z21 Asymptomatic human immunodeficiency virus [HIV] infection status: Secondary | ICD-10-CM | POA: Insufficient documentation

## 2023-05-18 DIAGNOSIS — R9431 Abnormal electrocardiogram [ECG] [EKG]: Secondary | ICD-10-CM | POA: Diagnosis not present

## 2023-05-18 HISTORY — DX: Gastro-esophageal reflux disease without esophagitis: K21.9

## 2023-05-18 MED ORDER — ALUM & MAG HYDROXIDE-SIMETH 200-200-20 MG/5ML PO SUSP
30.0000 mL | Freq: Once | ORAL | Status: AC
  Administered 2023-05-18: 30 mL via ORAL

## 2023-05-18 MED ORDER — PANTOPRAZOLE SODIUM 20 MG PO TBEC: 20.0000 mg | DELAYED_RELEASE_TABLET | Freq: Two times a day (BID) | ORAL | 1 refills | Status: AC

## 2023-05-18 MED ORDER — PANTOPRAZOLE SODIUM 40 MG PO TBEC
40.0000 mg | DELAYED_RELEASE_TABLET | Freq: Once | ORAL | Status: AC
  Administered 2023-05-18: 40 mg via ORAL
  Filled 2023-05-18: qty 1

## 2023-05-18 NOTE — ED Provider Notes (Signed)
[facility] Provider Note   CSN: [id number] Arrival date & time: 05/18/23  1743     History  Chief Complaint  Patient presents with   Heartburn    Collin Thomas is a 43 y.o. male.  Patient with a complaint of acid reflux epigastric abdominal pain for a long time.  Was getting worked lately.  Followed by [facility] clinic.  They gave him Zofran for nausea.  He did not realize he thought that was for the reflux.  Upon checking in here tonight they gave him Maalox and he felt much better.  Patient's past medical history significant for HIV is on antivirals.  Patient denies any coughing up blood or vomiting blood.  He does not want a workup of abdominal pain here tonight.  Says he feels better after the Maalox.  He likes for something to help him with the reflux.  He does have [facility] to follow-up with and they do have GI specialist that can see him there as well.  Vital signs here are good temp 98 pulse 65 respirations 22 blood pressure 117/76 oxygen saturation is 100%.  Patient does describe it as a burning type sensation with epigastric discomfort.  Patient did have an EKG done with has no acute findings.  But he did not have any GI labs done.  He does not want any.       Home Medications Prior to Admission medications   Medication Sig Start Date End Date Taking? Authorizing Provider  pantoprazole (PROTONIX) 20 MG tablet Take 1 tablet (20 mg total) by mouth 2 (two) times daily. 05/18/23  Yes Vanetta Mulders, MD  Albuterol-Budesonide (AIRSUPRA) 90-80 MCG/ACT AERO Inhale 2 puffs into the lungs every 4 (four) hours as needed. 04/24/23   Nyoka Cowden, MD  bictegravir-emtricitabine-tenofovir AF ([name]) 50-200-25 MG TABS tablet Take 1 tablet by mouth daily. 04/17/23   Veryl Speak, FNP  valACYclovir (VALTREX) 1000 MG tablet Take 1 tablet daily for 5 days during HSV outbreak as needed. 04/17/23   Veryl Speak, FNP      Allergies     Bee pollen    Review of Systems   Review of Systems  Constitutional:  Negative for chills and fever.  HENT:  Negative for ear pain and sore throat.   Eyes:  Negative for pain and visual disturbance.  Respiratory:  Negative for cough and shortness of breath.   Cardiovascular:  Positive for chest pain. Negative for palpitations.  Gastrointestinal:  Positive for abdominal pain. Negative for vomiting.  Genitourinary:  Negative for dysuria and hematuria.  Musculoskeletal:  Negative for arthralgias and back pain.  Skin:  Negative for color change and rash.  Neurological:  Negative for seizures and syncope.  All other systems reviewed and are negative.   Physical Exam Updated Vital Signs BP 115/65 (BP Location: Right Arm)   Pulse 65   Temp 98.1 F (36.7 C) (Oral)   Resp 17   Ht 1.905 m (6\' 3" )   Wt 72.6 kg   SpO2 100%   BMI 20.00 kg/m  Physical Exam Vitals and nursing note reviewed.  Constitutional:      General: He is not in acute distress.    Appearance: Normal appearance. He is well-developed.  HENT:     Head: Normocephalic and atraumatic.  Eyes:     Conjunctiva/sclera: Conjunctivae normal.  Cardiovascular:     Rate and Rhythm: Normal rate and regular rhythm.  Heart sounds: No murmur heard. Pulmonary:     Effort: Pulmonary effort is normal. No respiratory distress.     Breath sounds: Normal breath sounds.  Abdominal:     Palpations: Abdomen is soft.     Tenderness: There is no abdominal tenderness.  Musculoskeletal:        General: No swelling.     Cervical back: Neck supple.  Skin:    General: Skin is warm and dry.     Capillary Refill: Capillary refill takes less than 2 seconds.  Neurological:     Mental Status: He is alert.  Psychiatric:        Mood and Affect: Mood normal.     ED Results / Procedures / Treatments   Labs (all labs ordered are listed, but only abnormal results are displayed) Labs Reviewed - No data to display  EKG EKG  Interpretation Date/Time:  Sunday May 18 2023 17:53:19 EST Ventricular Rate:  60 PR Interval:  114 QRS Duration:  92 QT Interval:  404 QTC Calculation: 404 R Axis:   81  Text Interpretation: Normal sinus rhythm Normal ECG When compared with ECG of 04-May-2021 09:22, PREVIOUS ECG IS PRESENT Artifact Confirmed by Koren Plyler ([id number]) on 05/18/2023 10:35:02 PM  Radiology No results found.  Procedures Procedures    Medications Ordered in ED Medications  alum & mag hydroxide-simeth (MAALOX/MYLANTA) 200-200-20 MG/5ML suspension 30 mL (30 mLs Oral Given 05/18/23 1759)  pantoprazole (PROTONIX) EC tablet 40 mg (40 mg Oral Given 05/18/23 2309)    ED Course/ Medical Decision Making/ A&P                                 Medical Decision Making Risk OTC drugs. Prescription drug management.     Patient does not want any additional workup for the epigastric any kind of the reflux symptoms.  He says the Maalox made him feel better.  Does not want additional labs does not want any studies done.  Patient nontoxic based on vital signs no acute distress.  This does not seem to be cardiac in nature EKG looked fine.  Judgment had to be used.  Will treat him with Protonix.  He will follow back up with [facility].  Work note provided.   Final Clinical Impression(s) / ED Diagnoses Final diagnoses:  Gastroesophageal reflux disease, unspecified whether esophagitis present    Rx / DC Orders ED Discharge Orders          Ordered    pantoprazole (PROTONIX) 20 MG tablet  2 times daily        02 /02/25 2258              Vanetta Mulders, MD 05/18/23 2312

## 2023-05-18 NOTE — ED Triage Notes (Signed)
Pt has been having acid reflux for years but recently it has been getting worse.  Pt has been taking zofran prn for nausea.

## 2023-05-18 NOTE — Discharge Instructions (Addendum)
Follow-up with your doctors.  May need upper endoscopy for further evaluation may need follow-up with gastroenterology.  Try the Protonix for now.  If it does not help or things get worse she need to get seen.  As we discussed we have not really done a very thorough workup of your abdomen.  Work note provided to be out of work until Wednesday.

## 2023-05-18 NOTE — ED Notes (Signed)
 ED Provider at bedside.

## 2023-06-02 ENCOUNTER — Encounter: Payer: Self-pay | Admitting: Internal Medicine

## 2023-06-02 ENCOUNTER — Ambulatory Visit (INDEPENDENT_AMBULATORY_CARE_PROVIDER_SITE_OTHER): Payer: BC Managed Care – PPO | Admitting: Internal Medicine

## 2023-06-02 ENCOUNTER — Other Ambulatory Visit: Payer: Self-pay

## 2023-06-02 ENCOUNTER — Telehealth: Payer: Self-pay | Admitting: Family

## 2023-06-02 VITALS — BP 134/89 | HR 73 | Temp 98.0°F

## 2023-06-02 DIAGNOSIS — F4329 Adjustment disorder with other symptoms: Secondary | ICD-10-CM

## 2023-06-02 DIAGNOSIS — R197 Diarrhea, unspecified: Secondary | ICD-10-CM

## 2023-06-02 DIAGNOSIS — Z9189 Other specified personal risk factors, not elsewhere classified: Secondary | ICD-10-CM

## 2023-06-02 DIAGNOSIS — B20 Human immunodeficiency virus [HIV] disease: Secondary | ICD-10-CM | POA: Diagnosis not present

## 2023-06-02 MED ORDER — ATORVASTATIN CALCIUM 40 MG PO TABS: 40.0000 mg | ORAL_TABLET | Freq: Every day | ORAL | 3 refills | Status: AC

## 2023-06-02 NOTE — Telephone Encounter (Signed)
 Collin Thomas left a voicemail requesting an urgent call from his provider, Marcos Eke. He can be reached at [phone].

## 2023-06-02 NOTE — Patient Instructions (Signed)
 Please pick up lipitor today -- take once a day once diarrhea resolves, to reduce risk stroke/heart attack   Viral load testing   Avoid high dairy/big meals for the next 4-6 weeks Take isotonic water and peptol bismol as needed for diarrhea   F/u sooner then 6 weeks as needed if fever, chill, bloody diarrhea or >3 times a day diarrhea

## 2023-06-02 NOTE — Progress Notes (Signed)
 Brief Narrative   Patient ID: Collin Thomas, male    DOB: 1980-04-29, 43 y.o.   MRN: [id number]  Collin Thomas is a 43 y/o male diagnosed with HIV disease in January 2023 with risk factor of MSM. Initial viral load was 6.8 million with CD4 count 610. Genotype with no significant medication resistant mutations. Entered care at [facility] Stage 1. No history of opportunistic infection. Sole ART regimen of [name].   Subjective:    No chief complaint on file.   HPI:  Collin Thomas is a 43 y.o. male with HIV disease last seen on 12/24/22 by Aggie Cosier, PharmD, CPP on 12/24/22 with well controlled virus and good adherence and tolerance to [name]. Viral load was undetectable and CD4 count 842. STD testing was negative. Seen in the interim for symptoms of viral sore throat. Previous renal function and electrolytes within normal ranges. Here today for routine follow up.    Collin Thomas has been doing well since his last office visit and continues to take [facility] as prescribed with no adverse side effects or problems obtaining medication from the pharmacy.  Has concerns about potential exposure to sexually transmitted infections (STIs) due to a new sexual relationship. He expresses a desire to avoid a repeat of past experiences where he felt embarrassed and concerned for his health. He has been informed about Doxycycline post-exposure prophylaxis (Doxy-PEP) by a friend and is interested in this as a potential option for STI prevention. He acknowledges the need for continued condom use and understands that Doxy-PEP is not a complete solution but sees it as a potential additional layer of protection.  Also has persistent respiratory symptoms, despite a recent course of Prednisone. He describes a sensation of pressure in the chest and has noticed swelling in the neck. He reports that the Prednisone provided significant relief but did not completely resolve his symptoms.Mentions fatigue, which he  attributes to a recent heavy workload.  Dealing with the psychological impact of living with HIV. Expresses feelings of grief and is considering seeking therapy to help manage these emotions. Concerns about his smoking habits, acknowledging that this could be contributing to his respiratory symptoms and overall health.  Condoms and site specific STD testing offered. Healthcare maintenance reviewed.   Denies fevers, chills, night sweats, headaches, changes in vision, neck pain/stiffness, nausea, diarrhea, vomiting, lesions or rashes.  Lab Results  Component Value Date   CD4TCELL 42 04/17/2023   CD4TABS 842 12/24/2022   Lab Results  Component Value Date   HIV1RNAQUANT Not Detected 04/17/2023      ------------ 06/02/23 id clinic f/u Patient is here for acute care Diarrhea 1 week and decreased appetite   He has some emotional disturbance due to work related colleages conversation about AIDS/HIV/stigma  He wonders if stress can cause diarrhea  His partner Anette Riedel doesn't have diarrhea. However reports feeling under the weather and just got over a cold (which the patient had as well prior to this diarrhea bout)  Diarrhea is watery. No blood. Twice a day diarrhea   He wants to get checked viral load concerning about viral rebound with the diarrhea which he thinks is acute infection     Allergies  Allergen Reactions   Bee Pollen Cough    Hay fever symptoms       Outpatient Medications Prior to Visit  Medication Sig Dispense Refill   Albuterol-Budesonide (AIRSUPRA) 90-80 MCG/ACT AERO Inhale 2 puffs into the lungs every 4 (four) hours as needed. 10.7 g 11  bictegravir-emtricitabine-tenofovir AF ([name]) 50-200-25 MG TABS tablet Take 1 tablet by mouth daily. 90 tablet 3   pantoprazole (PROTONIX) 20 MG tablet Take 1 tablet (20 mg total) by mouth 2 (two) times daily. 14 tablet 1   valACYclovir (VALTREX) 1000 MG tablet Take 1 tablet daily for 5 days during HSV outbreak as  needed. 30 tablet 2   No facility-administered medications prior to visit.     Past Medical History:  Diagnosis Date   GERD (gastroesophageal reflux disease)    Gonorrhea    Herpes    HIV (human immunodeficiency virus infection) (HCC)    Syphilis      No past surgical history on file.    Review of Systems  Constitutional:  Negative for appetite change, chills, fatigue, fever and unexpected weight change.  Eyes:  Negative for visual disturbance.  Respiratory:  Negative for cough, chest tightness, shortness of breath and wheezing.   Cardiovascular:  Negative for chest pain and leg swelling.  Gastrointestinal:  Negative for abdominal pain, constipation, diarrhea, nausea and vomiting.  Genitourinary:  Negative for dysuria, flank pain, frequency, genital sores, hematuria and urgency.  Skin:  Negative for rash.  Allergic/Immunologic: Negative for immunocompromised state.  Neurological:  Negative for dizziness and headaches.      Objective:    There were no vitals taken for this visit. Nursing note and vital signs reviewed.  Physical Exam Constitutional:      General: He is not in acute distress.    Appearance: He is well-developed.  Eyes:     Conjunctiva/sclera: Conjunctivae normal.  Cardiovascular:     Rate and Rhythm: Normal rate and regular rhythm.     Heart sounds: Normal heart sounds. No murmur heard.    No friction rub. No gallop.  Pulmonary:     Effort: Pulmonary effort is normal. No respiratory distress.     Breath sounds: Normal breath sounds. No wheezing or rales.  Chest:     Chest wall: No tenderness.  Abdominal:     General: Bowel sounds are normal.     Palpations: Abdomen is soft.     Tenderness: There is no abdominal tenderness.  Musculoskeletal:     Cervical back: Neck supple.  Lymphadenopathy:     Cervical: No cervical adenopathy.  Skin:    General: Skin is warm and dry.     Findings: No rash.  Neurological:     Mental Status: He is alert and  oriented to person, place, and time.  Psychiatric:        Behavior: Behavior normal.        Thought Content: Thought content normal.        Judgment: Judgment normal.         04/17/2023    9:10 AM 11/04/2022   10:32 AM 04/22/2022    9:48 AM 05/10/2021    3:36 PM  Depression screen PHQ 2/9  Decreased Interest 0 0 0 3  Down, Depressed, Hopeless 0 0 0 3  PHQ - 2 Score 0 0 0 6  Altered sleeping    3  Tired, decreased energy    3  Change in appetite    2  Feeling bad or failure about yourself     0  Trouble concentrating    0  Moving slowly or fidgety/restless    0  Suicidal thoughts    0  PHQ-9 Score    14  Difficult doing work/chores    Not difficult at all  Assessment & Plan:    Patient Active Problem List   Diagnosis Date Noted   Asthmatic bronchitis , chronic (HCC) 04/24/2023   Cigarette smoker 04/17/2023   Grief 04/17/2023   Respiratory symptoms 02/18/2023   Healthcare maintenance 08/28/2022   Other fatigue 08/28/2022   Impetigo 06/21/2021   Vaccine counseling 05/17/2021   Routine screening for STI (sexually transmitted infection) 05/17/2021   Syphilis 05/17/2021   Encounter for medication monitoring 05/17/2021   Ear fullness 05/17/2021   HIV disease (HCC) 05/10/2021   High risk sexual behavior 05/10/2021     Problem List Items Addressed This Visit     HIV disease (HCC) - Primary   Relevant Orders   HIV 1 RNA quant-no reflex-bld   CBC w/Diff   COMPLETE METABOLIC PANEL WITH GFR   Other Visit Diagnoses       Adjustment disorder with other symptom       Relevant Orders   HIV 1 RNA quant-no reflex-bld   CBC w/Diff   COMPLETE METABOLIC PANEL WITH GFR     Diarrhea, unspecified type       Relevant Orders   HIV 1 RNA quant-no reflex-bld   CBC w/Diff   COMPLETE METABOLIC PANEL WITH GFR     At high risk for coronary artery disease          #hiv Discussed potential viral rebound with acute infection a possibility Reasonable to recheck viral load  today Consider protection while recovering   #diarrhea Appears infectious in nature No red flag Patient can continue suppportive care (peptol bismol, fluid; if starting to feel better can consider immodium as needed); avoid high dairy products contents in meal for the next 4-6 weeks If bloody diarrhea, fever, chill, >3times a day diarrhea developed or didarrhea lasting > 4-6 weeks let me know   #adjustment issue #anxiety  Will refer to our clinic mental health provider Will defer any pharmacologic intervention now  F/u with Tammy Sours in 6-8 weeks  Note for off work excuse for this week   #hcm -cad/cv risk factors Discussed reprieve trial and he is ok starting cholesterol    Follow-up: Return in about 6 weeks (around 07/14/2023). or sooner if needed.    Collin Eke, MSN, FNP-C Nurse Practitioner [facility]ious [facility] Main number: [phone]

## 2023-06-02 NOTE — Telephone Encounter (Signed)
 Called patient back regarding message. Will send mychart message for additional information.

## 2023-06-04 ENCOUNTER — Encounter: Payer: Self-pay | Admitting: Internal Medicine

## 2023-06-04 LAB — CBC WITH DIFFERENTIAL/PLATELET
Absolute Lymphocytes: 1667 {cells}/uL (ref 850–3900)
Absolute Monocytes: 429 {cells}/uL (ref 200–950)
Basophils Absolute: 28 {cells}/uL (ref 0–200)
Basophils Relative: 0.5 %
Eosinophils Absolute: 28 {cells}/uL (ref 15–500)
Eosinophils Relative: 0.5 %
HCT: 44.4 % (ref 38.5–50.0)
Hemoglobin: 15.3 g/dL (ref 13.2–17.1)
MCH: 31.7 pg (ref 27.0–33.0)
MCHC: 34.5 g/dL (ref 32.0–36.0)
MCV: 91.9 fL (ref 80.0–100.0)
MPV: 9.7 fL (ref 7.5–12.5)
Monocytes Relative: 7.8 %
Neutro Abs: 3350 {cells}/uL (ref 1500–7800)
Neutrophils Relative %: 60.9 %
Platelets: 425 10*3/uL — ABNORMAL HIGH (ref 140–400)
RBC: 4.83 10*6/uL (ref 4.20–5.80)
RDW: 11.9 % (ref 11.0–15.0)
Total Lymphocyte: 30.3 %
WBC: 5.5 10*3/uL (ref 3.8–10.8)

## 2023-06-04 LAB — COMPLETE METABOLIC PANEL WITH GFR
AG Ratio: 1.7 (calc) (ref 1.0–2.5)
ALT: 14 U/L (ref 9–46)
AST: 19 U/L (ref 10–40)
Albumin: 4.8 g/dL (ref 3.6–5.1)
Alkaline phosphatase (APISO): 56 U/L (ref 36–130)
BUN: 10 mg/dL (ref 7–25)
CO2: 22 mmol/L (ref 20–32)
Calcium: 9.6 mg/dL (ref 8.6–10.3)
Chloride: 103 mmol/L (ref 98–110)
Creat: 1.17 mg/dL (ref 0.60–1.29)
Globulin: 2.9 g/dL (ref 1.9–3.7)
Glucose, Bld: 101 mg/dL — ABNORMAL HIGH (ref 65–99)
Potassium: 4.2 mmol/L (ref 3.5–5.3)
Sodium: 135 mmol/L (ref 135–146)
Total Bilirubin: 0.7 mg/dL (ref 0.2–1.2)
Total Protein: 7.7 g/dL (ref 6.1–8.1)
eGFR: 80 mL/min/{1.73_m2} (ref >=60)

## 2023-06-04 LAB — HIV-1 RNA QUANT-NO REFLEX-BLD
HIV 1 RNA Quant: NOT DETECTED {copies}/mL
HIV-1 RNA Quant, Log: NOT DETECTED {Log}

## 2023-06-10 ENCOUNTER — Other Ambulatory Visit: Payer: Self-pay

## 2023-06-10 ENCOUNTER — Ambulatory Visit: Payer: BC Managed Care – PPO | Admitting: Licensed Clinical Social Worker

## 2023-06-10 DIAGNOSIS — F4329 Adjustment disorder with other symptoms: Secondary | ICD-10-CM

## 2023-06-10 NOTE — Progress Notes (Signed)
   THERAPIST PROGRESS NOTE  Session Time: 92  Participation Level: Active  Behavioral Response: Casual, Alert, Anxious  Type of Therapy: Individual Therapy  Treatment Goals addressed: Client and therapist discussed goals of working on past trauma, anxiety, and depression.  ProgressTowards Goals: Initial  Interventions: Motivational Interviewing  Summary: Collin Thomas is a 43 y.o. male.   Suicidal/Homicidal: denies SI/HI/AVH  Therapist Response: Therapist met with client for OMH-IT to include ongoing assessment, support, and reinforcement. Therapist allowed client to "check in" since previous session; asking client to share any positive coping skills they may have used over the previous week, along with any challenges faced. Therapist provided supportive listening as client processed their thoughts, emotional responses, and behaviors surrounding several stressors.   Client reports a recent work situation that has caused stress and anxiety in the work situation. Client and therapist discussed goals to work on anxiety and past trauma.  Client was alert, oriented x3, with no SI, HI, or symptoms of psychosis (risk low). Client was pleasant and friendly, engaging openly and appropriately with therapist, benefiting from supportive listening and exploration of feelings. The client appeared to benefit from the therapist's non-judgmental posture, and client centered approach. Future INTX sessions scheduled. Next session recommended in one to two weeks.   Plan: referred to [facility] for continued services due to insurance  Diagnosis: Adjustment Disorder  Patient was advised Release of Information must be obtained prior to any record release in order to collaborate their care with an outside provider. Patient/Guardian was advised if they have not already done so to contact the registration department to sign all necessary forms in order for [place] to release information regarding their care.    Consent: Patient gives verbal consent for treatment and assignment of benefits for services provided during this visit. Patient/Guardian expressed understanding and agreed to proceed.   Collin Thomas, [place] 06/10/2023

## 2023-06-12 ENCOUNTER — Other Ambulatory Visit: Payer: Self-pay

## 2023-06-12 ENCOUNTER — Encounter: Payer: Self-pay | Admitting: Family

## 2023-06-12 ENCOUNTER — Ambulatory Visit (INDEPENDENT_AMBULATORY_CARE_PROVIDER_SITE_OTHER): Payer: BC Managed Care – PPO | Admitting: Family

## 2023-06-12 VITALS — BP 116/71 | HR 73 | Temp 98.0°F | Wt 157.0 lb

## 2023-06-12 DIAGNOSIS — F4322 Adjustment disorder with anxiety: Secondary | ICD-10-CM | POA: Diagnosis not present

## 2023-06-12 DIAGNOSIS — B20 Human immunodeficiency virus [HIV] disease: Secondary | ICD-10-CM

## 2023-06-12 DIAGNOSIS — R85619 Unspecified abnormal cytological findings in specimens from anus: Secondary | ICD-10-CM

## 2023-06-12 HISTORY — DX: Unspecified abnormal cytological findings in specimens from anus: R85.619

## 2023-06-12 NOTE — Assessment & Plan Note (Signed)
 Collin Thomas is undergoing adjustment disorder related to his recent diagnosis of HIV as well as the events leading up to his absence from work from 05/31/2023 until 06/11/2023.  Has been seen by counseling and has follow-up on 06/19/2023.  Would recommend with continued counseling and coping mechanisms.  May possibly benefit from hydroxyzine or propranolol, however would recommend staying off medications at this time as his situation appears to be improving.  Will complete paperwork related to his absence from work for short-term disability.  Continue with coping mechanisms and behavioral therapy per counseling.

## 2023-06-12 NOTE — Assessment & Plan Note (Signed)
 Abnormal anal pap with ASCUS. No evidence of HPV. Plan to repeat in 6 months. If remains abnormal will send for HRA.

## 2023-06-12 NOTE — Patient Instructions (Addendum)
 Nice to see you.  Continue to take your medication daily as prescribed.  Refills have been sent to the pharmacy.  Plan for follow up in July or sooner if needed with lab work on the same day.  Have a great day and stay safe!

## 2023-06-12 NOTE — Assessment & Plan Note (Signed)
 Collin Thomas continues to have well-controlled virus with good adherence and tolerance to [employer].  Reviewed previous lab work and discussed plan of care and U equals U.  Continue current dose of [name].  Plan for follow-up in 4 months or sooner if needed with lab work on the same day.
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
## 2023-06-16 ENCOUNTER — Telehealth: Payer: Self-pay

## 2023-06-16 NOTE — Telephone Encounter (Signed)
 Called patient to inform him that his paperwork has been completed by provider, but before we could fax it patient would need to sign last page.  Will stop by office and sign form. Juanita Laster, RMA

## 2023-06-16 NOTE — Telephone Encounter (Signed)
 Patient stopped by office and signed last sheet for paperwork. Will fax to [phone]. Pt did state he still needs Certification of Health Form completed and faxed by provider. Was completed on 11/04/22. Needs to be done annually.  Form placed in providers box.  Juanita Laster, RMA

## 2023-06-19 DIAGNOSIS — F4323 Adjustment disorder with mixed anxiety and depressed mood: Secondary | ICD-10-CM | POA: Diagnosis not present

## 2023-06-24 NOTE — Telephone Encounter (Signed)
 Received fax from [facility] requesting clarification on paper work. Additional questions placed in provider's box. Provider aware. Pt understands office will update him once this is completed.

## 2023-06-25 NOTE — Telephone Encounter (Signed)
 [place] paper work for the patient faxed.  A copy placed in triage and scanning.

## 2023-06-27 DIAGNOSIS — B351 Tinea unguium: Secondary | ICD-10-CM | POA: Diagnosis not present

## 2023-06-27 DIAGNOSIS — L608 Other nail disorders: Secondary | ICD-10-CM | POA: Diagnosis not present

## 2023-07-03 ENCOUNTER — Encounter: Payer: Self-pay | Admitting: Family

## 2023-07-03 ENCOUNTER — Other Ambulatory Visit (HOSPITAL_COMMUNITY)
Admission: RE | Admit: 2023-07-03 | Discharge: 2023-07-03 | Disposition: A | Discharge status: Home / Self Care | Source: Ambulatory Visit | Attending: Family | Admitting: Family

## 2023-07-03 ENCOUNTER — Ambulatory Visit: Admitting: Family

## 2023-07-03 ENCOUNTER — Ambulatory Visit: Payer: BC Managed Care – PPO | Admitting: Internal Medicine

## 2023-07-03 ENCOUNTER — Other Ambulatory Visit: Payer: Self-pay

## 2023-07-03 VITALS — BP 130/79 | HR 98 | Temp 97.8°F | Ht 75.0 in | Wt 155.0 lb

## 2023-07-03 DIAGNOSIS — F4323 Adjustment disorder with mixed anxiety and depressed mood: Secondary | ICD-10-CM | POA: Diagnosis not present

## 2023-07-03 DIAGNOSIS — R5383 Other fatigue: Secondary | ICD-10-CM

## 2023-07-03 DIAGNOSIS — Z113 Encounter for screening for infections with a predominantly sexual mode of transmission: Secondary | ICD-10-CM | POA: Insufficient documentation

## 2023-07-03 DIAGNOSIS — F17210 Nicotine dependence, cigarettes, uncomplicated: Secondary | ICD-10-CM | POA: Diagnosis not present

## 2023-07-03 DIAGNOSIS — B20 Human immunodeficiency virus [HIV] disease: Secondary | ICD-10-CM

## 2023-07-03 DIAGNOSIS — R739 Hyperglycemia, unspecified: Secondary | ICD-10-CM

## 2023-07-03 MED ORDER — MIRTAZAPINE 7.5 MG PO TABS: 7.5000 mg | ORAL_TABLET | Freq: Every day | ORAL | 3 refills | Status: AC

## 2023-07-03 NOTE — Assessment & Plan Note (Signed)
 Mr. Robison continues to have fatigue which I believe is related to his anxiety and probably decreased sleep.  Discussed treatment options including medication and continued counseling.  Will start low-dose mirtazapine to help with sleep and underlying anxiety and he will continue with counseling.  Currently with no medical/metabolic findings that would be concerning for his symptoms.

## 2023-07-03 NOTE — Patient Instructions (Addendum)
Nice to see you.  We will check your lab work today.  Continue to take your medication daily as prescribed.  Refills have been sent to the pharmacy.  Have a great day and stay safe!  

## 2023-07-03 NOTE — Assessment & Plan Note (Signed)
 Collin Thomas continues to have well-controlled virus with good adherence and tolerance to [employer].  Reviewed previous lab work and discussed plan of care and U equals U.  No problems obtaining medication from the pharmacy.  Check blood work per request.  Continue current dose of [name].  Social determinants of health reviewed with no interventions indicated.  Plan for follow-up in 4 months or sooner if needed with lab work on the same day.

## 2023-07-03 NOTE — Assessment & Plan Note (Signed)
 Continues to smoke tobacco daily and discussed importance of tobacco cessation to reduce risk of complications in the future and development of disease.  Counseled on resources available for tobacco cessation.

## 2023-07-03 NOTE — Progress Notes (Signed)
 Brief Narrative   Patient ID: Collin Thomas, male    DOB: [birth year], 43 y.o.   MRN: [id number]  Collin Thomas is a 43 y/o male diagnosed with HIV disease in January 2023 with risk factor of MSM. Initial viral load was 6.8 million with CD4 count 610. Genotype with no significant medication resistant mutations. Entered care at [facility] Stage 1. No history of opportunistic infection. Sole ART regimen of [name].   Subjective:    Chief Complaint  Patient presents with   Follow-up    HPI:  Collin Thomas is a 43 y.o. male with HIV disease last seen on 06/12/2023 with well-controlled virus and good adherence and tolerance to [employer].  Viral load was undetectable with CD4 count 724.  Kidney function, liver function, electrolytes within normal ranges.  Here today for follow-up.  Collin Thomas has been doing okay since his last office visit and continues to take [facility] as prescribed with no adverse side effects or problems obtaining medication from the pharmacy.  Covered by [employer].  Has been feeling off for the last several days and is concerned about possible low blood sugar imbalances and he has not been getting much sleep.  Continues to have issues at work with coworkers not following his lead or on his team.  Describes a unfriendly working environment.  Continues to have concerns about the same individual and some new individuals now.  Working with counseling.  Requesting completion of FMLA paperwork that was returned to him.  Denies fevers, chills, night sweats, headaches, changes in vision, neck pain/stiffness, nausea, diarrhea, vomiting, lesions or rashes.  Lab Results  Component Value Date   CD4TCELL 42 04/17/2023   CD4TABS 842 12/24/2022   Lab Results  Component Value Date   HIV1RNAQUANT Not Detected 06/02/2023     Allergies  Allergen Reactions   Bee Pollen Cough    Hay fever symptoms       Outpatient Medications Prior to Visit  Medication Sig Dispense  Refill   Albuterol-Budesonide (AIRSUPRA) 90-80 MCG/ACT AERO Inhale 2 puffs into the lungs every 4 (four) hours as needed. 10.7 g 11   bictegravir-emtricitabine-tenofovir AF ([name]) 50-200-25 MG TABS tablet Take 1 tablet by mouth daily. 90 tablet 3   valACYclovir (VALTREX) 1000 MG tablet Take 1 tablet daily for 5 days during HSV outbreak as needed. 30 tablet 2   atorvastatin (LIPITOR) 40 MG tablet Take 1 tablet (40 mg total) by mouth daily. (Patient not taking: Reported on 07/03/2023) 90 tablet 3   pantoprazole (PROTONIX) 20 MG tablet Take 1 tablet (20 mg total) by mouth 2 (two) times daily. (Patient not taking: Reported on 07/03/2023) 14 tablet 1   No facility-administered medications prior to visit.     Past Medical History:  Diagnosis Date   GERD (gastroesophageal reflux disease)    Gonorrhea    Herpes    HIV (human immunodeficiency virus infection) (HCC)    Syphilis      No past surgical history on file.    Review of Systems    Objective:    BP 130/79   Pulse 98   Temp 97.8 F (36.6 C) (Oral)   Ht 6\' 3"  (1.905 m)   Wt 155 lb (70.3 kg)   SpO2 99%   BMI 19.37 kg/m  Nursing note and vital signs reviewed.  Physical Exam      07/03/2023    1:53 PM 04/17/2023    9:10 AM 11/04/2022   10:32 AM 04/22/2022  9:48 AM 05/10/2021    3:36 PM  Depression screen PHQ 2/9  Decreased Interest 0 0 0 0 3  Down, Depressed, Hopeless 0 0 0 0 3  PHQ - 2 Score 0 0 0 0 6  Altered sleeping     3  Tired, decreased energy     3  Change in appetite     2  Feeling bad or failure about yourself      0  Trouble concentrating     0  Moving slowly or fidgety/restless     0  Suicidal thoughts     0  PHQ-9 Score     14  Difficult doing work/chores     Not difficult at all       Assessment & Plan:    Patient Active Problem List   Diagnosis Date Noted   Abnormal anal Papanicolaou smear 06/12/2023   Asthmatic bronchitis , chronic (HCC) 04/24/2023   Cigarette smoker 04/17/2023    Adjustment disorder 04/17/2023   Respiratory symptoms 02/18/2023   Healthcare maintenance 08/28/2022   Other fatigue 08/28/2022   Impetigo 06/21/2021   Vaccine counseling 05/17/2021   Routine screening for STI (sexually transmitted infection) 05/17/2021   Syphilis 05/17/2021   Encounter for medication monitoring 05/17/2021   Ear fullness 05/17/2021   HIV disease (HCC) 05/10/2021   High risk sexual behavior 05/10/2021     Problem List Items Addressed This Visit       Other   HIV disease (HCC) - Primary   Collin Thomas continues to have well-controlled virus with good adherence and tolerance to [employer].  Reviewed previous lab work and discussed plan of care and U equals U.  No problems obtaining medication from the pharmacy.  Check blood work per request.  Continue current dose of [name].  Social determinants of health reviewed with no interventions indicated.  Plan for follow-up in 4 months or sooner if needed with lab work on the same day.      Relevant Orders   HIV-1 RNA quant-no reflex-bld   T-helper cell (CD4)- ([facility] clinic only)   Other fatigue   Collin Thomas continues to have fatigue which I believe is related to his anxiety and probably decreased sleep.  Discussed treatment options including medication and continued counseling.  Will start low-dose mirtazapine to help with sleep and underlying anxiety and he will continue with counseling.  Currently with no medical/metabolic findings that would be concerning for his symptoms.      Cigarette smoker   Continues to smoke tobacco daily and discussed importance of tobacco cessation to reduce risk of complications in the future and development of disease.  Counseled on resources available for tobacco cessation.      Other Visit Diagnoses       Screening for STDs (sexually transmitted diseases)       Relevant Orders   RPR   Urine cytology ancillary only   Cytology (oral, anal, urethral) ancillary only   Cytology (oral,  anal, urethral) ancillary only     Elevated blood sugar       Relevant Orders   HgB A1c        I am having Collin Thomas start on mirtazapine. I am also having him maintain his [name], valACYclovir, Airsupra, pantoprazole, and atorvastatin.   Meds ordered this encounter  Medications   mirtazapine (REMERON) 7.5 MG tablet    Sig: Take 1 tablet (7.5 mg total) by mouth at bedtime.    Dispense:  30 tablet  Refill:  3    Supervising Provider:   Judyann Munson [[id number]]     Follow-up: Return in about 4 months (around 11/02/2023). or sooner if needed.    Collin Eke, MSN, FNP-C Nurse Practitioner [facility] for Infectious Disease [facility] Main number: [phone]

## 2023-07-04 LAB — URINE CYTOLOGY ANCILLARY ONLY
Chlamydia: NEGATIVE
Comment: NEGATIVE
Comment: NORMAL
Neisseria Gonorrhea: NEGATIVE

## 2023-07-04 LAB — CYTOLOGY, (ORAL, ANAL, URETHRAL) ANCILLARY ONLY
Chlamydia: NEGATIVE
Chlamydia: NEGATIVE
Comment: NEGATIVE
Comment: NEGATIVE
Comment: NORMAL
Comment: NORMAL
Neisseria Gonorrhea: NEGATIVE
Neisseria Gonorrhea: NEGATIVE

## 2023-07-04 LAB — T-HELPER CELL (CD4) - (RCID CLINIC ONLY)
CD4 % Helper T Cell: 41 % (ref 33–65)
CD4 T Cell Abs: 566 /uL (ref 400–1790)

## 2023-07-05 LAB — HEMOGLOBIN A1C
Hgb A1c MFr Bld: 5.1 %{Hb} (ref <5.7)
Mean Plasma Glucose: 100 mg/dL
eAG (mmol/L): 5.5 mmol/L

## 2023-07-05 LAB — HIV-1 RNA QUANT-NO REFLEX-BLD
HIV 1 RNA Quant: NOT DETECTED {copies}/mL
HIV-1 RNA Quant, Log: NOT DETECTED {Log_copies}/mL

## 2023-07-05 LAB — RPR: RPR Ser Ql: NONREACTIVE

## 2023-07-15 ENCOUNTER — Ambulatory Visit: Payer: BC Managed Care – PPO | Admitting: Internal Medicine

## 2023-07-18 DIAGNOSIS — A549 Gonococcal infection, unspecified: Secondary | ICD-10-CM | POA: Insufficient documentation

## 2023-07-18 DIAGNOSIS — K219 Gastro-esophageal reflux disease without esophagitis: Secondary | ICD-10-CM | POA: Insufficient documentation

## 2023-07-18 DIAGNOSIS — B009 Herpesviral infection, unspecified: Secondary | ICD-10-CM | POA: Insufficient documentation

## 2023-07-21 ENCOUNTER — Ambulatory Visit: Payer: BC Managed Care – PPO

## 2023-07-21 DIAGNOSIS — A549 Gonococcal infection, unspecified: Secondary | ICD-10-CM

## 2023-07-21 DIAGNOSIS — B009 Herpesviral infection, unspecified: Secondary | ICD-10-CM

## 2023-07-21 NOTE — Progress Notes (Deleted)
 Cardiology Consultation:    Date:  07/21/2023   ID:  Collin Thomas, DOB 1980-12-12, MRN [id number]  PCP:  Courtney Paris, NP  Cardiologist:  Luretha Murphy, MD   Referring MD: Courtney Paris, NP   No chief complaint on file.    ASSESSMENT AND PLAN:   Mr. Collin Thomas 43 year old male Problem List Items Addressed This Visit     Gonorrhea - Primary   Herpes      History of Present Illness:    Collin Thomas is a 43 y.o. male who is being seen today for the evaluation of *** at the request of Courtney Paris, NP.   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   Adjustment disorder 04/17/2023   Aphthous ulcer 02/03/2020   Asthmatic bronchitis , chronic (HCC) 04/24/2023   Active smoker  - Onset Nov 2024 on background of smoking cigs/ allergic rhinitis with cat in house since 2021   - Airsupra trial 04/24/2023   - PFT's 04/24/2023 >>>      Cigarette smoker 04/17/2023   Ear fullness 05/17/2021   Encounter for medication monitoring 05/17/2021   GERD (gastroesophageal reflux disease)    Gonorrhea    Healthcare maintenance 08/28/2022   Herpes    High risk sexual behavior 05/10/2021   HIV (human immunodeficiency virus infection) (HCC)    HIV disease (HCC) 05/10/2021   Impetigo 06/21/2021   Other fatigue 08/28/2022   Respiratory symptoms 02/18/2023   Routine screening for STI (sexually transmitted infection) 05/17/2021   Syphilis    Vaccine counseling 05/17/2021    No past surgical history on file.  Current Medications: Current Meds  Medication Sig   doxycycline (VIBRA-TABS) 100 MG tablet Take 100 mg by mouth as directed. Take 2 tablets by mouth as needed within 72 hours of sexual contact.   HYDROcodone-acetaminophen (NORCO/VICODIN) 5-325 MG tablet Take 1 tablet by mouth every 6 (six) hours as needed.   Vitamin D, Ergocalciferol, (DRISDOL) 1.25 MG (50000 UNIT) CAPS capsule Take  1 capsule by mouth once a week.     Allergies:   Bee pollen   Social History   Socioeconomic History   Marital status: Single    Spouse name: Not on file   Number of children: Not on file   Years of education: Not on file   Highest education level: Not on file  Occupational History   Not on file  Tobacco Use   Smoking status: Some Days    Types: Cigarettes   Smokeless tobacco: Former  Substance and Sexual Activity   Alcohol use: Yes    Comment: occ   Drug use: Not Currently   Sexual activity: Yes    Comment: declined condoms  Other Topics Concern   Not on file  Social History Narrative   Not on file   Social Drivers of Health   Financial Resource Strain: Not on file  Food Insecurity: Not on file  Transportation Needs: Not on file  Physical Activity: Not on file  Stress: Not on file  Social Connections: Not on file     Family History: The patient's family history is not on file. ROS:   Please see the history of present illness.    All 14 point review of systems negative except as described per history of present illness.  EKGs/Labs/Other Studies Reviewed:    The following studies were reviewed today:   EKG:  Recent Labs: 06/02/2023: ALT 14; BUN 10; Creat 1.17; Hemoglobin 15.3; Platelets 425; Potassium 4.2; Sodium 135  Recent Lipid Panel    Component Value Date/Time   CHOL 163 05/17/2021 1522   TRIG 138 05/17/2021 1522   HDL 29 (L) 05/17/2021 1522   CHOLHDL 5.6 (H) 05/17/2021 1522   LDLCALC 108 (H) 05/17/2021 1522    Physical Exam:    VS:  There were no vitals taken for this visit.    Wt Readings from Last 3 Encounters:  07/03/23 155 lb (70.3 kg)  06/12/23 157 lb (71.2 kg)  05/18/23 160 lb (72.6 kg)     GENERAL:  Well nourished, well developed in no acute distress NECK: No JVD; No carotid bruits CARDIAC: RRR, S1 and S2 present, no murmurs, no rubs, no gallops CHEST:  Clear to auscultation without rales, wheezing or rhonchi  Extremities:  No pitting pedal edema. Pulses bilaterally symmetric with radial 2+ and dorsalis pedis 2+ NEUROLOGIC:  Alert and oriented x 3  Medication Adjustments/Labs and Tests Ordered: Current medicines are reviewed at length with the patient today.  Concerns regarding medicines are outlined above.  No orders of the defined types were placed in this encounter.  No orders of the defined types were placed in this encounter.   Signed, Cecille Amsterdam, MD, MPH, [facility]. 07/21/2023 12:31 PM    [facility]

## 2023-09-05 NOTE — Progress Notes (Signed)
 The 10-year ASCVD risk score (Arnett DK, et al., 2019) is: 5.9%   Values used to calculate the score:     Age: 43 years     Sex: Male     Is Non-Hispanic African American: No     Diabetic: No     Tobacco smoker: Yes     Systolic Blood Pressure: 130 mmHg     Is BP treated: No     HDL Cholesterol: 29 mg/dL     Total Cholesterol: 163 mg/dL  Currently prescribed atorvastatin  40 mg.  Kathyleen Radice, BSN, RN

## 2023-09-09 ENCOUNTER — Telehealth: Payer: Self-pay | Admitting: Family

## 2023-09-09 ENCOUNTER — Ambulatory Visit (INDEPENDENT_AMBULATORY_CARE_PROVIDER_SITE_OTHER): Admitting: Family

## 2023-09-09 ENCOUNTER — Other Ambulatory Visit: Payer: Self-pay

## 2023-09-09 ENCOUNTER — Encounter: Payer: Self-pay | Admitting: Family

## 2023-09-09 ENCOUNTER — Ambulatory Visit: Admitting: Infectious Disease

## 2023-09-09 VITALS — Wt 154.0 lb

## 2023-09-09 DIAGNOSIS — B20 Human immunodeficiency virus [HIV] disease: Secondary | ICD-10-CM | POA: Diagnosis not present

## 2023-09-09 DIAGNOSIS — F439 Reaction to severe stress, unspecified: Secondary | ICD-10-CM | POA: Diagnosis not present

## 2023-09-09 DIAGNOSIS — Z113 Encounter for screening for infections with a predominantly sexual mode of transmission: Secondary | ICD-10-CM

## 2023-09-09 MED ORDER — BIKTARVY 50-200-25 MG PO TABS: 1.0000 | ORAL_TABLET | Freq: Every day | ORAL | 0 refills | Status: AC

## 2023-09-09 NOTE — Patient Instructions (Addendum)
 Nice to see you.  We will check your lab work today.  Continue to take your medication daily as prescribed.  Refills have been sent to the pharmacy.  Plan for follow up in 3 months or sooner if needed with lab work on the same day.  Mental Health Resources  988: can call or text 24/7  [facility]: Address: [address], [place], [place] [address] Open 24 hours Phone: [phone]  [facility] [place]: Address: [address], [place], [place] [address] Phone: [phone] Appointments: [email or web address]

## 2023-09-09 NOTE — Assessment & Plan Note (Signed)
 Collin Thomas has increased levels of situational stress that are multifactorial including concern for the care his partner received at an ED, changes in the [employer], and work related stressors (previously discussed). His symptoms are likely related to this situational stress and adjustment disorder although cannot fully exclude some fatigue as a side effect of medication. Would likely benefit from continued counseling services. Mental health resources provided in AVS.

## 2023-09-09 NOTE — Assessment & Plan Note (Signed)
 Collin Thomas has well controlled virus with good adherence to [name] . Reviewed previous lab work and discussed recommended plan of care to continue current dose of [name] . Suspect symptoms are unrelated to HIV/[name]  and will check lab work to ensure continued viral suppression. Plan for follow up in 3 months or sooner if needed with lab work on the same day.

## 2023-09-09 NOTE — Progress Notes (Signed)
 RN was walking patient back to exam room from the lobby. Patient asked who would be seeing him today. Replied that he was scheduled with Collin Thomas. Patient states "okay because that other guy is about to get choked."  RN replied, "excuse me?" Patient states "nothing."  Placed patient in exam room. His partner is accompanying him to his appointment.   No further rooming or vitals done due to patient's comment. Practice administrator notified.   Tahir Blank, BSN, RN

## 2023-09-09 NOTE — Telephone Encounter (Signed)
 I was made aware of Mr. Centrella interactions with clinical staff and practice manager earlier in the day prior to my arrival to the clinic. Before the office visit I was informed by the clinical staff that during the rooming process Mr. Som mentioned that the provider from earlier in the day was going to get choked and upon attempt to confirm what he said never mind. During appointment he was permitted to express his frustrations and concerns. He did not wish to discuss anything any further and requested lab work and to leave. Conversation was held with the Engineer, manufacturing following the appointment to discuss next course of action.

## 2023-09-09 NOTE — Progress Notes (Signed)
 Brief Narrative   Patient ID: Collin Thomas, male    DOB: 1980-08-01, 43 y.o.   MRN: [id number]  Collin Thomas is a 43 y/o male diagnosed with HIV disease in January 2023 with risk factor of MSM. Initial viral load was 6.8 million with CD4 count 610. Genotype with no significant medication resistant mutations. Entered care at [facility] Stage 1. No history of opportunistic infection. Sole ART regimen of [name] .   Subjective:   Chief Complaint  Patient presents with   Acute Visit    HPI:  Collin Thomas is a 43 y.o. male with HIV disease last seen on 07/03/2023 with well-controlled virus and good adherence and tolerance to [name] .  Viral load was undetectable with CD4 count 566.  Kidney function, liver function, electrolytes within normal ranges.  STD testing was negative for gonorrhea, chlamydia, and syphilis. Here today for an acute visit.   Collin Thomas continues to take his [name]  as prescribed with no problems obtaining medication from the pharmacy. Has concerns the medication is not working and something is wrong. Last night had increased levels of fatigue, lethargy, stomach issues and feeling dizzy after he eats. He was not able to go to work secondary to the symptoms that he was experiencing.   He is here today with his partner and expresses displeasure and frustration with the care that his partner received at an ED and felt that he was being dismissed when later he went to another hospital where he was admitted and treated for sepsis. Has concern about the level of care and understanding that he has received within the [facility]. Under extreme pressure between work, managing medical conditions and the on-going events within the [employer].   Wishing to be tested for everything as something is off and would like to complete lab work and leave.  Denies fevers, chills, night sweats, headaches, changes in vision, neck pain/stiffness,, diarrhea, vomiting, lesions or  rashes.  Lab Results  Component Value Date   CD4TCELL 41 07/03/2023   CD4TABS 566 07/03/2023   Lab Results  Component Value Date   HIV1RNAQUANT NOT DETECTED 07/03/2023     Allergies  Allergen Reactions   Bee Pollen Cough    Hay fever symptoms       Outpatient Medications Prior to Visit  Medication Sig Dispense Refill   Albuterol-Budesonide (AIRSUPRA ) 90-80 MCG/ACT AERO Inhale 2 puffs into the lungs every 4 (four) hours as needed. 10.7 g 11   atorvastatin  (LIPITOR) 40 MG tablet Take 1 tablet (40 mg total) by mouth daily. (Patient not taking: Reported on 07/03/2023) 90 tablet 3   doxycycline  (VIBRA -TABS) 100 MG tablet Take 100 mg by mouth as directed. Take 2 tablets by mouth as needed within 72 hours of sexual contact.     HYDROcodone -acetaminophen (NORCO/VICODIN) 5-325 MG tablet Take 1 tablet by mouth every 6 (six) hours as needed.     mirtazapine  (REMERON ) 7.5 MG tablet Take 1 tablet (7.5 mg total) by mouth at bedtime. 30 tablet 3   pantoprazole  (PROTONIX ) 20 MG tablet Take 1 tablet (20 mg total) by mouth 2 (two) times daily. (Patient not taking: Reported on 07/03/2023) 14 tablet 1   valACYclovir  (VALTREX ) 1000 MG tablet Take 1 tablet daily for 5 days during HSV outbreak as needed. 30 tablet 2   Vitamin D, Ergocalciferol, (DRISDOL) 1.25 MG (50000 UNIT) CAPS capsule Take 1 capsule by mouth once a week.     bictegravir-emtricitabine -tenofovir  AF ([name] ) 50-200-25 MG TABS tablet Take 1  tablet by mouth daily. 90 tablet 3   No facility-administered medications prior to visit.     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   Adjustment disorder 04/17/2023   Aphthous ulcer 02/03/2020   Asthmatic bronchitis , chronic (HCC) 04/24/2023   Active smoker  - Onset Nov 2024 on background of smoking cigs/ allergic rhinitis with cat in house since 2021   - Airsupra  trial 04/24/2023   - PFT's 04/24/2023  >>>      Cigarette smoker 04/17/2023   Ear fullness 05/17/2021   Encounter for medication monitoring 05/17/2021   GERD (gastroesophageal reflux disease)    Gonorrhea    Healthcare maintenance 08/28/2022   Herpes    High risk sexual behavior 05/10/2021   HIV (human immunodeficiency virus infection) (HCC)    HIV disease (HCC) 05/10/2021   Impetigo 06/21/2021   Other fatigue 08/28/2022   Respiratory symptoms 02/18/2023   Routine screening for STI (sexually transmitted infection) 05/17/2021   Syphilis    Vaccine counseling 05/17/2021     No past surgical history on file.      Review of Systems  Constitutional:  Positive for appetite change and fatigue. Negative for chills, fever and unexpected weight change.  Eyes:  Negative for visual disturbance.  Respiratory:  Negative for cough, chest tightness, shortness of breath and wheezing.   Cardiovascular:  Negative for chest pain and leg swelling.  Gastrointestinal:  Negative for abdominal pain, constipation, diarrhea, nausea and vomiting.  Genitourinary:  Negative for dysuria, flank pain, frequency, genital sores, hematuria and urgency.  Skin:  Negative for rash.  Allergic/Immunologic: Negative for immunocompromised state.  Neurological:  Positive for dizziness. Negative for headaches.  Psychiatric/Behavioral:  Positive for agitation. The patient is nervous/anxious.      Objective:   Wt 154 lb (69.9 kg)   BMI 19.25 kg/m  Nursing note and vital signs reviewed.  Physical Exam Deferred.      07/03/2023    1:53 PM 04/17/2023    9:10 AM 11/04/2022   10:32 AM 04/22/2022    9:48 AM 05/10/2021    3:36 PM  Depression screen PHQ 2/9  Decreased Interest 0 0 0 0 3  Down, Depressed, Hopeless 0 0 0 0 3  PHQ - 2 Score 0 0 0 0 6  Altered sleeping     3  Tired, decreased energy     3  Change in appetite     2  Feeling bad or failure about yourself      0  Trouble concentrating     0  Moving slowly or fidgety/restless     0   Suicidal thoughts     0  PHQ-9 Score     14  Difficult doing work/chores     Not difficult at all         No data to display           The 10-year ASCVD risk score (Arnett DK, et al., 2019) is: 5.9%   Values used to calculate the score:     Age: 49 years     Sex: Male     Is Non-Hispanic African American: No     Diabetic: No     Tobacco smoker: Yes     Systolic Blood Pressure: 130 mmHg     Is BP treated: No     HDL Cholesterol: 29 mg/dL     Total  Cholesterol: 163 mg/dL      Assessment & Plan:    Patient Active Problem List   Diagnosis Date Noted   Situational stress 09/09/2023   GERD (gastroesophageal reflux disease)    Gonorrhea    Herpes    Abnormal anal Papanicolaou smear 06/12/2023   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   Asthmatic bronchitis , chronic (HCC) 04/24/2023   Cigarette smoker 04/17/2023   Adjustment disorder 04/17/2023   Respiratory symptoms 02/18/2023   Healthcare maintenance 08/28/2022   Other fatigue 08/28/2022   Impetigo 06/21/2021   Vaccine counseling 05/17/2021   Routine screening for STI (sexually transmitted infection) 05/17/2021   Syphilis 05/17/2021   Encounter for medication monitoring 05/17/2021   Ear fullness 05/17/2021   HIV disease (HCC) 05/10/2021   High risk sexual behavior 05/10/2021   Aphthous ulcer 02/03/2020     Problem List Items Addressed This Visit       Other   HIV disease (HCC) - Primary   Collin Thomas has well controlled virus with good adherence to [name] . Reviewed previous lab work and discussed recommended plan of care to continue current dose of [name] . Suspect symptoms are unrelated to HIV/[name]  and will check lab work to ensure continued viral suppression. Plan for follow up in 3 months or sooner if needed with lab work on the same day.       Relevant Medications   bictegravir-emtricitabine -tenofovir  AF ([name] ) 50-200-25 MG TABS tablet   Other Relevant Orders   Comprehensive  metabolic panel with GFR   CBC with Differential/Platelet   HIV-1 RNA quant-no reflex-bld   T-helper cells (CD4) count (not at [facility])   Situational stress   Collin Thomas has increased levels of situational stress that are multifactorial including concern for the care his partner received at an ED, changes in the [employer], and work related stressors (previously discussed). His symptoms are likely related to this situational stress and adjustment disorder although cannot fully exclude some fatigue as a side effect of medication. Would likely benefit from continued counseling services. Mental health resources provided in AVS.      Other Visit Diagnoses       Screening for STDs (sexually transmitted diseases)       Relevant Orders   RPR   CT/NG RNA, TMA Rectal   GC/CT Probe, Amp (Throat)   C. trachomatis/N. gonorrhoeae RNA        I am having Collin Thomas maintain his valACYclovir , Airsupra , pantoprazole , atorvastatin , mirtazapine , doxycycline , Vitamin D (Ergocalciferol), HYDROcodone -acetaminophen, and [name] .   Meds ordered this encounter  Medications   bictegravir-emtricitabine -tenofovir  AF ([name] ) 50-200-25 MG TABS tablet    Sig: Take 1 tablet by mouth daily.    Dispense:  90 tablet    Refill:  0    Supervising Provider:   Liane Redman [phone]    Prescription Type::   Renewal     Follow-up: Return in about 3 months (around 12/10/2023). or sooner if needed.    Collin Silva, MSN, FNP-C Nurse Practitioner [facility] for Infectious Disease [facility] Main number: [phone]

## 2023-09-09 NOTE — Telephone Encounter (Signed)
 Walden called demanding to be seen in the 11am slot that was originally scheduled with Dr. Ernie Heal. Prior to this call, he spoke with the referral coordinator who scheduled him with Ernie Heal, but the nurse requested he see his provider, Marlan Silva, this afternoon to address the concern since 15 minutes would not suffice. Shortly after, she called the patient back to inform him he could be seen by his provider this afternoon but the phone call was disconnected. When the patient called back, I informed him the appt had been moved to the afternoon but he again demanded the provider see him when he arrives and the provider step out of the meeting. Once he arrived around 11:20, he demanded to be seen and our Engineer, manufacturing spoke with him. She requested he fill out a medical records release, after he stated he would not be coming back, but he left before doing so. He re-entered and left several times and stated he would be bringing [employer] to his appointment at 2:30. He came back in after lunch, around 12:45 stating how upset and disappointed he was with our office. He asked who our Engineer, manufacturing was and who was above her.

## 2023-09-10 LAB — C. TRACHOMATIS/N. GONORRHOEAE RNA
C. trachomatis RNA, TMA: NOT DETECTED
N. gonorrhoeae RNA, TMA: NOT DETECTED

## 2023-09-10 LAB — GC/CHLAMYDIA PROBE, AMP (THROAT)
Chlamydia trachomatis RNA: NOT DETECTED
Neisseria gonorrhoeae RNA: NOT DETECTED

## 2023-09-10 LAB — CT/NG RNA, TMA RECTAL
Chlamydia Trachomatis RNA: NOT DETECTED
Neisseria Gonorrhoeae RNA: NOT DETECTED

## 2023-09-11 ENCOUNTER — Ambulatory Visit: Payer: Self-pay | Admitting: Family

## 2023-09-11 LAB — CBC WITH DIFFERENTIAL/PLATELET
Absolute Lymphocytes: 1931 {cells}/uL (ref 850–3900)
Absolute Monocytes: 371 {cells}/uL (ref 200–950)
Basophils Absolute: 52 {cells}/uL (ref 0–200)
Basophils Relative: 0.8 %
Eosinophils Absolute: 33 {cells}/uL (ref 15–500)
Eosinophils Relative: 0.5 %
HCT: 47.5 % (ref 38.5–50.0)
Hemoglobin: 16.1 g/dL (ref 13.2–17.1)
MCH: 33.1 pg — ABNORMAL HIGH (ref 27.0–33.0)
MCHC: 33.9 g/dL (ref 32.0–36.0)
MCV: 97.7 fL (ref 80.0–100.0)
MPV: 9.3 fL (ref 7.5–12.5)
Monocytes Relative: 5.7 %
Neutro Abs: 4115 {cells}/uL (ref 1500–7800)
Neutrophils Relative %: 63.3 %
Platelets: 309 10*3/uL (ref 140–400)
RBC: 4.86 10*6/uL (ref 4.20–5.80)
RDW: 12.6 % (ref 11.0–15.0)
Total Lymphocyte: 29.7 %
WBC: 6.5 10*3/uL (ref 3.8–10.8)

## 2023-09-11 LAB — COMPREHENSIVE METABOLIC PANEL WITH GFR
AG Ratio: 1.9 (calc) (ref 1.0–2.5)
ALT: 26 U/L (ref 9–46)
AST: 32 U/L (ref 10–40)
Albumin: 5 g/dL (ref 3.6–5.1)
Alkaline phosphatase (APISO): 51 U/L (ref 36–130)
BUN: 7 mg/dL (ref 7–25)
CO2: 26 mmol/L (ref 20–32)
Calcium: 9.5 mg/dL (ref 8.6–10.3)
Chloride: 106 mmol/L (ref 98–110)
Creat: 1.22 mg/dL (ref 0.60–1.29)
Globulin: 2.7 g/dL (ref 1.9–3.7)
Glucose, Bld: 78 mg/dL (ref 65–99)
Potassium: 3.9 mmol/L (ref 3.5–5.3)
Sodium: 142 mmol/L (ref 135–146)
Total Bilirubin: 0.6 mg/dL (ref 0.2–1.2)
Total Protein: 7.7 g/dL (ref 6.1–8.1)
eGFR: 76 mL/min/{1.73_m2} (ref >=60)

## 2023-09-11 LAB — RPR: RPR Ser Ql: NONREACTIVE

## 2023-09-11 LAB — T-HELPER CELLS (CD4) COUNT (NOT AT ARMC)
Absolute CD4: 826 {cells}/uL (ref 490–1740)
CD4 T Helper %: 42 % (ref 30–61)
Total lymphocyte count: 1973 {cells}/uL (ref 850–3900)

## 2023-09-11 LAB — HIV-1 RNA QUANT-NO REFLEX-BLD
HIV 1 RNA Quant: NOT DETECTED {copies}/mL
HIV-1 RNA Quant, Log: NOT DETECTED {Log_copies}/mL

## 2023-09-17 ENCOUNTER — Telehealth: Payer: Self-pay

## 2023-09-17 NOTE — Telephone Encounter (Signed)
 Patient called for update on FMLA paper work that was faxed to office by [name]. States he missed one day after appt due to fatigue and is now required to complete form for renewal. Form completed by provider today. Faxed to [phone]. Collin Thomas is aware that form has been faxed. Will call if not received.  Julien Odor, RMA

## 2023-09-26 ENCOUNTER — Ambulatory Visit
Admission: EM | Admit: 2023-09-26 | Discharge: 2023-09-26 | Disposition: A | Discharge status: Home / Self Care | Attending: Family Medicine | Admitting: Family Medicine

## 2023-09-26 ENCOUNTER — Other Ambulatory Visit: Payer: Self-pay

## 2023-09-26 DIAGNOSIS — S61209A Unspecified open wound of unspecified finger without damage to nail, initial encounter: Secondary | ICD-10-CM

## 2023-09-26 NOTE — ED Provider Notes (Signed)
 [facility]    CSN: [id number] Arrival date & time: 09/26/23  1452      History   Chief Complaint No chief complaint on file.   HPI Collin Thomas is a 43 y.o. male presents for a skin laceration.  Patient reports earlier today he cut his right fourth finger on a piece of glass.  He states he rinsed it thoroughly but it continued to bleed prompting come in for evaluation.  He states he tried to butterfly himself but the bleeding would not be controlled.  He denies blood thinning medications.  He does report history of HIV that is undetectable.  He is up-to-date on his tetanus.  No other concerns at this time.  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   Adjustment disorder 04/17/2023   Aphthous ulcer 02/03/2020   Asthmatic bronchitis , chronic (HCC) 04/24/2023   Active smoker  - Onset Nov 2024 on background of smoking cigs/ allergic rhinitis with cat in house since 2021   - Airsupra  trial 04/24/2023   - PFT's 04/24/2023 >>>      Cigarette smoker 04/17/2023   Ear fullness 05/17/2021   Encounter for medication monitoring 05/17/2021   GERD (gastroesophageal reflux disease)    Gonorrhea    Healthcare maintenance 08/28/2022   Herpes    High risk sexual behavior 05/10/2021   HIV (human immunodeficiency virus infection) (HCC)    HIV disease (HCC) 05/10/2021   Impetigo 06/21/2021   Other fatigue 08/28/2022   Respiratory symptoms 02/18/2023   Routine screening for STI (sexually transmitted infection) 05/17/2021   Syphilis    Vaccine counseling 05/17/2021    Patient Active Problem List   Diagnosis Date Noted   Situational stress 09/09/2023   GERD (gastroesophageal reflux disease)    Gonorrhea    Herpes    Abnormal anal Papanicolaou smear 06/12/2023   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 No pertinent surgical history.     Home Medications    Prior to Admission medications   Medication Sig Start Date End Date Taking? Authorizing Provider  Albuterol-Budesonide (AIRSUPRA ) 90-80 MCG/ACT AERO Inhale 2 puffs into the lungs every 4 (four) hours as needed. 04/24/23   Diamond Formica, MD  atorvastatin  (LIPITOR) 40 MG tablet Take 1 tablet (40 mg total) by mouth daily. Patient not taking: Reported on 07/03/2023 06/02/23 06/01/24  Cephas Collier T, MD  bictegravir-emtricitabine -tenofovir  AF ([name] ) 50-200-25 MG TABS tablet Take 1 tablet by mouth daily. 09/09/23   Calone, Gregory D, FNP  doxycycline  (VIBRA -TABS) 100 MG tablet Take 100 mg by mouth as directed. Take 2 tablets by mouth as needed within 72 hours of sexual contact. 04/17/23   [provider]  HYDROcodone -acetaminophen (NORCO/VICODIN) 5-325 MG tablet Take 1 tablet by mouth every 6 (six) hours as needed. 04/29/23   [provider]  mirtazapine  (REMERON ) 7.5 MG tablet Take  1 tablet (7.5 mg total) by mouth at bedtime. 07/03/23   Calone, Gregory D, FNP  pantoprazole  (PROTONIX ) 20 MG tablet Take 1 tablet (20 mg total) by mouth 2 (two) times daily. Patient not taking: Reported on 07/03/2023 05/18/23   Zackowski, Scott, MD  valACYclovir  (VALTREX ) 1000 MG tablet Take 1 tablet daily for 5 days during HSV outbreak as needed. 04/17/23   Calone, Gregory D, FNP  Vitamin D, Ergocalciferol, (DRISDOL) 1.25 MG (50000 UNIT) CAPS capsule Take 1 capsule by mouth  once a week. 05/16/23   [provider]    Family History History reviewed. No pertinent family history.  Social History Social History   Tobacco Use   Smoking status: Some Days    Types: Cigarettes   Smokeless tobacco: Former  Substance Use Topics   Alcohol use: Yes    Comment: occ   Drug use: Not Currently     Allergies   Bee pollen   Review of Systems Review of Systems  Skin:        Skin lac of finger     Physical Exam Triage Vital Signs ED Triage Vitals  Encounter Vitals Group     BP 09/26/23 1459 125/83     Girls Systolic BP Percentile --      Girls Diastolic BP Percentile --      Boys Systolic BP Percentile --      Boys Diastolic BP Percentile --      Pulse Rate 09/26/23 1459 79     Resp 09/26/23 1459 16     Temp 09/26/23 1459 97.9 F (36.6 C)     Temp Source 09/26/23 1459 Oral     SpO2 09/26/23 1459 93 %     Weight --      Height --      Head Circumference --      Peak Flow --      Pain Score 09/26/23 1457 5     Pain Loc --      Pain Education --      Exclude from Growth Chart --    No data found.  Updated Vital Signs BP 125/83   Pulse 79   Temp 97.9 F (36.6 C) (Oral)   Resp 16   SpO2 93%   Visual Acuity Right Eye Distance:   Left Eye Distance:   Bilateral Distance:    Right Eye Near:   Left Eye Near:    Bilateral Near:     Physical Exam Vitals and nursing note reviewed.  Constitutional:      Appearance: Normal appearance.  HENT:     Head: Normocephalic and atraumatic.   Eyes:     Pupils: Pupils are equal, round, and reactive to light.    Cardiovascular:     Rate and Rhythm: Normal rate.  Pulmonary:     Effort: Pulmonary effort is normal.   Musculoskeletal:       Hands:     Comments: There is a superficial 0.15 cm triangular skin avulsion with intact skin flap to the distal dorsal aspect of the right fourth finger.  There is no active bleeding.  No tendon involvement.  Nails intact.  Cap refill +2.  Does not  overlie joint.   Skin:    General: Skin is warm and dry.   Neurological:     General: No focal deficit present.     Mental Status: He is alert and oriented to person, place, and time.   Psychiatric:  Mood and Affect: Mood normal.        Behavior: Behavior normal.      UC Treatments / Results  Labs (all labs ordered are listed, but only abnormal results are displayed) Labs Reviewed - No data to display  EKG   Radiology No results found.  Procedures Procedures (including critical care time)  Medications Ordered in UC Medications - No data to display  Initial Impression / Assessment and Plan / UC Course  I have reviewed the triage vital signs and the nursing notes.  Pertinent labs & imaging results that were available during my care of the patient were reviewed by me and considered in my medical decision making (see chart for details).     Reviewed exam and symptoms with patient.  No red flags.  Very superficial minor skin avulsion to distal finger.  No active bleeding at time of exam.  Patient requested closure.  Area was closed with Dermabond and nonadherent dressing.  He states he is up-to-date on his tetanus.  Wound care and signs and symptoms of infection reviewed.  Patient to follow-up as needed. Final Clinical Impressions(s) / UC Diagnoses   Final diagnoses:  Avulsion of skin of finger, initial encounter   Discharge Instructions   None    ED Prescriptions   None    PDMP not reviewed this encounter.   Alleen Arbour, NP 09/26/23 1526

## 2023-09-26 NOTE — ED Triage Notes (Signed)
 Pt states he was cleaning a glass shelf and lacerated his right 4th finger. Pt has a tiny laceration on posterior tip of right 4th finger. Bleeding is controlled.

## 2023-10-20 ENCOUNTER — Ambulatory Visit: Payer: BC Managed Care – PPO | Admitting: Family

## 2023-10-28 DIAGNOSIS — M79645 Pain in left finger(s): Secondary | ICD-10-CM | POA: Diagnosis not present

## 2023-11-18 ENCOUNTER — Ambulatory Visit: Admitting: Family

## 2023-11-19 DIAGNOSIS — Z681 Body mass index (BMI) 19 or less, adult: Secondary | ICD-10-CM | POA: Diagnosis not present

## 2023-11-19 DIAGNOSIS — Z21 Asymptomatic human immunodeficiency virus [HIV] infection status: Secondary | ICD-10-CM | POA: Diagnosis not present

## 2023-11-27 DIAGNOSIS — L603 Nail dystrophy: Secondary | ICD-10-CM | POA: Diagnosis not present

## 2023-11-27 DIAGNOSIS — M79645 Pain in left finger(s): Secondary | ICD-10-CM | POA: Diagnosis not present

## 2023-12-03 ENCOUNTER — Emergency Department (HOSPITAL_BASED_OUTPATIENT_CLINIC_OR_DEPARTMENT_OTHER)
Admission: EM | Admit: 2023-12-03 | Discharge: 2023-12-03 | Disposition: A | Discharge status: Home / Self Care | Attending: Emergency Medicine | Admitting: Emergency Medicine

## 2023-12-03 ENCOUNTER — Other Ambulatory Visit: Payer: Self-pay

## 2023-12-03 ENCOUNTER — Encounter (HOSPITAL_BASED_OUTPATIENT_CLINIC_OR_DEPARTMENT_OTHER): Payer: Self-pay

## 2023-12-03 DIAGNOSIS — M79645 Pain in left finger(s): Secondary | ICD-10-CM | POA: Diagnosis not present

## 2023-12-03 DIAGNOSIS — R112 Nausea with vomiting, unspecified: Secondary | ICD-10-CM | POA: Insufficient documentation

## 2023-12-03 MED ORDER — ONDANSETRON 4 MG PO TBDP
4.0000 mg | ORAL_TABLET | Freq: Once | ORAL | Status: AC
  Administered 2023-12-03: 4 mg via ORAL
  Filled 2023-12-03: qty 1

## 2023-12-03 MED ORDER — ONDANSETRON 4 MG PO TBDP: ORAL_TABLET | ORAL | 0 refills | Status: AC

## 2023-12-03 NOTE — ED Triage Notes (Signed)
 Pt reports sx on left middle finger sx last week. Pain so bad he took 3-4 hydrocodone  approx 4am. And vomited x2 with red tint Smells of alcohol on breath. Endorses not drinking a lot. Feels loopy and high

## 2023-12-03 NOTE — ED Notes (Signed)
 Patient had nail removed from left middle finger. No signs of infection at this time. Cleaned with wound cleanser and applied xeroform and cling.

## 2023-12-03 NOTE — Discharge Instructions (Signed)
 Follow up with your ortho in the office.  Please return for worsening symptoms redness or fever.  Take 4 over the counter ibuprofen tablets 3 times a day or 2 over-the-counter naproxen tablets twice a day for pain.  Please take this with food or can upset your stomach.  Then take the pain medicine if you feel like you need it. Narcotics do not help with the pain, they only make you care about it less.  You can become addicted to this, people may break into your house to steal it.  It will constipate you.  If you drive under the influence of this medicine you can get a DUI.

## 2023-12-03 NOTE — ED Provider Notes (Signed)
 [facility] Provider Note   CSN: [id number] Arrival date & time: 12/03/23  9340     Patient presents with: Emesis   Collin Thomas is a 43 y.o. male.   43 yo M with a chief complaints of left third digit pain.  The patient had a procedure performed by Dr. Ortman.  Has kept the area wrapped as instructed.  Was supposed to remove it 12 days post procedure.  He has been having quite a bit of pain there.  Has been taking his prescribed narcotics every 2 hours.  He ended up drinking quite a bit last nights and ended up waking up this morning with a couple episodes of emesis.  No fevers.   Emesis      Prior to Admission medications   Medication Sig Start Date End Date Taking? Authorizing Provider  HYDROcodone -acetaminophen (NORCO/VICODIN) 5-325 MG tablet Take 1 tablet by mouth every 6 (six) hours as needed. 04/29/23  Yes [provider]  ondansetron  (ZOFRAN -ODT) 4 MG disintegrating tablet 4mg  ODT q4 hours prn nausea/vomit 12/03/23  Yes Dontavious Emily, DO  Albuterol-Budesonide (AIRSUPRA ) 90-80 MCG/ACT AERO Inhale 2 puffs into the lungs every 4 (four) hours as needed. 04/24/23   Darlean Ozell NOVAK, MD  atorvastatin  (LIPITOR) 40 MG tablet Take 1 tablet (40 mg total) by mouth daily. Patient not taking: Reported on 07/03/2023 06/02/23 06/01/24  Overton Faith T, MD  bictegravir-emtricitabine -tenofovir  AF ([name] ) 50-200-25 MG TABS tablet Take 1 tablet by mouth daily. 09/09/23   Calone, Gregory D, FNP  doxycycline  (VIBRA -TABS) 100 MG tablet Take 100 mg by mouth as directed. Take 2 tablets by mouth as needed within 72 hours of sexual contact. 04/17/23   [provider]  mirtazapine  (REMERON ) 7.5 MG tablet Take 1 tablet (7.5 mg total) by mouth at bedtime. 07/03/23   Calone, Gregory D, FNP  pantoprazole  (PROTONIX ) 20 MG tablet Take 1 tablet (20 mg total) by mouth 2 (two) times daily. 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 04/17/23   Calone, Gregory D, FNP  Vitamin D, Ergocalciferol, (DRISDOL) 1.25 MG (50000 UNIT) CAPS capsule Take 1 capsule by mouth once a week. 05/16/23   [provider]    Allergies: Bee pollen    Review of Systems  Gastrointestinal:  Positive for vomiting.    Updated Vital Signs BP 110/87   Pulse 96   Temp 98.4 F (36.9 C)   Resp 16   Ht 6' 3 (1.905 m)   Wt 72.6 kg   SpO2 95%   BMI 20.00 kg/m   Physical Exam Vitals and nursing note reviewed.  Constitutional:      Appearance: He is well-developed.  HENT:     Head: Normocephalic and atraumatic.  Eyes:     Pupils: Pupils are equal, round, and reactive to light.  Neck:     Vascular: No JVD.  Cardiovascular:     Rate and Rhythm: Normal rate and regular rhythm.     Heart sounds: No murmur heard.    No friction rub. No gallop.  Pulmonary:     Effort: No respiratory distress.     Breath sounds: No wheezing.  Abdominal:     General: There is no distension.     Tenderness: There is no abdominal tenderness. There is no guarding or rebound.  Musculoskeletal:  General: Normal range of motion.     Cervical back: Normal range of motion and neck supple.     Comments: Patient had a Coban wrapped left third digit.  Removed with improvement of his symptoms.  No obvious signs of infection.  No erythema no warmth no fluctuance.  Nail is surgically absent.  Skin:    Coloration: Skin is not pale.     Findings: No rash.  Neurological:     Mental Status: He is alert and oriented to person, place, and time.  Psychiatric:        Behavior: Behavior normal.     (all labs ordered are listed, but only abnormal results are displayed) Labs Reviewed - No data to display  EKG: None  Radiology: No results found.   Procedures   Medications Ordered in the ED  ondansetron  (ZOFRAN -ODT) disintegrating tablet 4 mg (4 mg  Oral Given 12/03/23 0738)                                    Medical Decision Making Risk Prescription drug management.   43 yo M with a chief complaints of left third digit pain.  Patient had a procedure done by hand surgery.  Sounds like he has been having trouble with his nail.  Had had a procedure done previously.  He is having quite a bit of pain there and so been taking his narcotics every 2 hours.  Started having nausea and vomiting this morning after going out with friends last night.  Had significant improvement with removal of the dressing, symptoms may have been done to the dressing being too tight.  Will clean and dress here.  Antiemetic.  Reassess.  Patient feels better but not back to normal.  Discussed care at home.  Encouraged him to let his hand surgeon know what happened.  8:19 AM:  I have discussed the diagnosis/risks/treatment options with the patient.  Evaluation and diagnostic testing in the emergency department does not suggest an emergent condition requiring admission or immediate intervention beyond what has been performed at this time.  They will follow up with PCP, Hand. We also discussed returning to the ED immediately if new or worsening sx occur. We discussed the sx which are most concerning (e.g., sudden worsening pain, fever, inability to tolerate by mouth) that necessitate immediate return. Medications administered to the patient during their visit and any new prescriptions provided to the patient are listed below.  Medications given during this visit Medications  ondansetron  (ZOFRAN -ODT) disintegrating tablet 4 mg (4 mg Oral Given 12/03/23 0738)     The patient appears reasonably screen and/or stabilized for discharge and I doubt any other medical condition or other [facility] requiring further screening, evaluation, or treatment in the ED at this time prior to discharge.       Final diagnoses:  Nausea and vomiting in adult    ED Discharge Orders           Ordered    ondansetron  (ZOFRAN -ODT) 4 MG disintegrating tablet        12/03/23 0817               Emil Share, DO 12/03/23 [phone]

## 2023-12-03 NOTE — ED Notes (Signed)
 Reviewed discharge instructions ,follow up and medications with pt. Pt states understanding. Pt calling [facility] to transport home. Work note provided. Ambulatory at discharge

## 2023-12-05 DIAGNOSIS — L0109 Other impetigo: Secondary | ICD-10-CM | POA: Diagnosis not present

## 2024-01-20 ENCOUNTER — Other Ambulatory Visit: Payer: Self-pay | Admitting: Family

## 2024-02-06 DIAGNOSIS — F10129 Alcohol abuse with intoxication, unspecified: Secondary | ICD-10-CM | POA: Diagnosis not present

## 2024-02-11 ENCOUNTER — Telehealth: Payer: Self-pay

## 2024-02-11 NOTE — Telephone Encounter (Signed)
 Received call from [facility] to fax lab to [facility] office to connect patient with another ID provider. Recent labs fax/emailed to [facility] as requested.

## 2024-03-30 DIAGNOSIS — B20 Human immunodeficiency virus [HIV] disease: Secondary | ICD-10-CM | POA: Diagnosis not present

## 2024-03-30 DIAGNOSIS — Z13220 Encounter for screening for lipoid disorders: Secondary | ICD-10-CM | POA: Diagnosis not present
